# Patient Record
Sex: Male | Born: 1985 | Race: White | Hispanic: No | Marital: Married | Smoking: Never smoker
Health system: Southern US, Community
[De-identification: ages and names within clinical notes are randomized; demographics above are authoritative.]

## PROBLEM LIST (undated history)

## (undated) ENCOUNTER — Emergency Department: Source: Home / Self Care

## (undated) DIAGNOSIS — K219 Gastro-esophageal reflux disease without esophagitis: Secondary | ICD-10-CM

## (undated) DIAGNOSIS — F419 Anxiety disorder, unspecified: Secondary | ICD-10-CM

## (undated) HISTORY — PX: HERNIA REPAIR: SHX51

## (undated) HISTORY — PX: OTHER SURGICAL HISTORY: SHX169

---
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
## 2015-08-20 ENCOUNTER — Emergency Department
Admission: EM | Admit: 2015-08-20 | Discharge: 2015-08-20 | Disposition: A | Discharge status: Home / Self Care | Attending: Emergency Medicine | Admitting: Emergency Medicine

## 2015-08-20 ENCOUNTER — Encounter: Payer: Self-pay | Admitting: Emergency Medicine

## 2015-08-20 DIAGNOSIS — X500XXA Overexertion from strenuous movement or load, initial encounter: Secondary | ICD-10-CM | POA: Diagnosis not present

## 2015-08-20 DIAGNOSIS — S46911A Strain of unspecified muscle, fascia and tendon at shoulder and upper arm level, right arm, initial encounter: Secondary | ICD-10-CM | POA: Diagnosis not present

## 2015-08-20 DIAGNOSIS — S161XXA Strain of muscle, fascia and tendon at neck level, initial encounter: Secondary | ICD-10-CM | POA: Insufficient documentation

## 2015-08-20 DIAGNOSIS — Y9289 Other specified places as the place of occurrence of the external cause: Secondary | ICD-10-CM | POA: Insufficient documentation

## 2015-08-20 DIAGNOSIS — S4991XA Unspecified injury of right shoulder and upper arm, initial encounter: Secondary | ICD-10-CM | POA: Diagnosis present

## 2015-08-20 DIAGNOSIS — Y9389 Activity, other specified: Secondary | ICD-10-CM | POA: Diagnosis not present

## 2015-08-20 DIAGNOSIS — Y990 Civilian activity done for income or pay: Secondary | ICD-10-CM | POA: Insufficient documentation

## 2015-08-20 HISTORY — DX: Gastro-esophageal reflux disease without esophagitis: K21.9

## 2015-08-20 MED ORDER — IBUPROFEN 800 MG PO TABS
800.0000 mg | ORAL_TABLET | Freq: Once | ORAL | Status: AC
  Administered 2015-08-20: 800 mg via ORAL
  Filled 2015-08-20: qty 1

## 2015-08-20 MED ORDER — HYDROCODONE-ACETAMINOPHEN 5-325 MG PO TABS: 1.0000 | ORAL_TABLET | ORAL | Status: DC | PRN

## 2015-08-20 MED ORDER — DIAZEPAM 5 MG PO TABS
5.0000 mg | ORAL_TABLET | Freq: Once | ORAL | Status: AC
  Administered 2015-08-20: 5 mg via ORAL
  Filled 2015-08-20: qty 1

## 2015-08-20 MED ORDER — CYCLOBENZAPRINE HCL 10 MG PO TABS: 10.0000 mg | ORAL_TABLET | Freq: Three times a day (TID) | ORAL | Status: DC | PRN

## 2015-08-20 MED ORDER — HYDROCODONE-ACETAMINOPHEN 5-325 MG PO TABS
2.0000 | ORAL_TABLET | Freq: Once | ORAL | Status: AC
  Administered 2015-08-20: 2 via ORAL
  Filled 2015-08-20: qty 2

## 2015-08-20 MED ORDER — IBUPROFEN 800 MG PO TABS: 800.0000 mg | ORAL_TABLET | Freq: Three times a day (TID) | ORAL | Status: DC | PRN

## 2015-08-20 NOTE — Discharge Instructions (Signed)
Cervical Sprain A cervical sprain is when the tissues (ligaments) that hold the neck bones in place stretch or tear. HOME CARE   Put ice on the injured area.  Put ice in a plastic bag.  Place a towel between your skin and the bag.  Leave the ice on for 15-20 minutes, 3-4 times a day.  You may have been given a collar to wear. This collar keeps your neck from moving while you heal.  Do not take the collar off unless told by your doctor.  If you have long hair, keep it outside of the collar.  Ask your doctor before changing the position of your collar. You may need to change its position over time to make it more comfortable.  If you are allowed to take off the collar for cleaning or bathing, follow your doctor's instructions on how to do it safely.  Keep your collar clean by wiping it with mild soap and water. Dry it completely. If the collar has removable pads, remove them every 1-2 days to hand wash them with soap and water. Allow them to air dry. They should be dry before you wear them in the collar.  Do not drive while wearing the collar.  Only take medicine as told by your doctor.  Keep all doctor visits as told.  Keep all physical therapy visits as told.  Adjust your work station so that you have good posture while you work.  Avoid positions and activities that make your problems worse.  Warm up and stretch before being active. GET HELP IF:  Your pain is not controlled with medicine.  You cannot take less pain medicine over time as planned.  Your activity level does not improve as expected. GET HELP RIGHT AWAY IF:   You are bleeding.  Your stomach is upset.  You have an allergic reaction to your medicine.  You develop new problems that you cannot explain.  You lose feeling (become numb) or you cannot move any part of your body (paralysis).  You have tingling or weakness in any part of your body.  Your symptoms get worse. Symptoms include:  Pain,  soreness, stiffness, puffiness (swelling), or a burning feeling in your neck.  Pain when your neck is touched.  Shoulder or upper back pain.  Limited ability to move your neck.  Headache.  Dizziness.  Your hands or arms feel week, lose feeling, or tingle.  Muscle spasms.  Difficulty swallowing or chewing. MAKE SURE YOU:   Understand these instructions.  Will watch your condition.  Will get help right away if you are not doing well or get worse.   This information is not intended to replace advice given to you by your health care provider. Make sure you discuss any questions you have with your health care provider.   Document Released: 03/10/2008 Document Revised: 05/25/2013 Document Reviewed: 03/30/2013 Elsevier Interactive Patient Education 2016 [employer]  Muscle Strain A muscle strain is an injury that occurs when a muscle is stretched beyond its normal length. Usually a small number of muscle fibers are torn when this happens. Muscle strain is rated in degrees. First-degree strains have the least amount of muscle fiber tearing and pain. Second-degree and third-degree strains have increasingly more tearing and pain.  Usually, recovery from muscle strain takes 1-2 weeks. Complete healing takes 5-6 weeks.  CAUSES  Muscle strain happens when a sudden, violent force placed on a muscle stretches it too far. This may occur with lifting, sports, or  a fall.  RISK FACTORS Muscle strain is especially common in athletes.  SIGNS AND SYMPTOMS At the site of the muscle strain, there may be:  Pain.  Bruising.  Swelling.  Difficulty using the muscle due to pain or lack of normal function. DIAGNOSIS  Your health care provider will perform a physical exam and ask about your medical history. TREATMENT  Often, the best treatment for a muscle strain is resting, icing, and applying cold compresses to the injured area.  HOME CARE INSTRUCTIONS   Use the PRICE method of treatment  to promote muscle healing during the first 2-3 days after your injury. The PRICE method involves:  Protecting the muscle from being injured again.  Restricting your activity and resting the injured body part.  Icing your injury. To do this, put ice in a plastic bag. Place a towel between your skin and the bag. Then, apply the ice and leave it on from 15-20 minutes each hour. After the third day, switch to moist heat packs.  Apply compression to the injured area with a splint or elastic bandage. Be careful not to wrap it too tightly. This may interfere with blood circulation or increase swelling.  Elevate the injured body part above the level of your heart as often as you can.  Only take over-the-counter or prescription medicines for pain, discomfort, or fever as directed by your health care provider.  Warming up prior to exercise helps to prevent future muscle strains. SEEK MEDICAL CARE IF:   You have increasing pain or swelling in the injured area.  You have numbness, tingling, or a significant loss of strength in the injured area. MAKE SURE YOU:   Understand these instructions.  Will watch your condition.  Will get help right away if you are not doing well or get worse.   This information is not intended to replace advice given to you by your health care provider. Make sure you discuss any questions you have with your health care provider.   Document Released: 09/22/2005 Document Revised: 07/13/2013 Document Reviewed: 04/21/2013 Elsevier Interactive Patient Education [employer]

## 2015-08-20 NOTE — ED Notes (Signed)
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

## 2015-08-20 NOTE — ED Provider Notes (Signed)
[facility] Emergency Department Provider Note  ____________________________________________  Time seen: Approximately 10:32 AM  I have reviewed the triage vital signs and the nursing notes.   HISTORY  Chief Complaint Shoulder Pain    HPI Andrew Baird is a 29 y.o. male who presents with complaints of left shoulder and neck pain after lifting helicopter blade. Patient states became worse yesterday increased pain with movement. 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   Past Medical History  Diagnosis Date  . GERD (gastroesophageal reflux disease)     There are no active problems to display for this patient.   History reviewed. No pertinent past surgical history.  Current Outpatient Rx  Name  Route  Sig  Dispense  Refill  . cyclobenzaprine (FLEXERIL) 10 MG tablet   Oral   Take 1 tablet (10 mg total) by mouth every 8 (eight) hours as needed for muscle spasms.   30 tablet   1   . HYDROcodone-acetaminophen (NORCO) 5-325 MG tablet   Oral   Take 1-2 tablets by mouth every 4 (four) hours as needed for moderate pain.   10 tablet   0   . ibuprofen (ADVIL,MOTRIN) 800 MG tablet   Oral   Take 1 tablet (800 mg total) by mouth every 8 (eight) hours as needed.   30 tablet   0     Allergies Review of patient's allergies indicates no known allergies.  History reviewed. No pertinent family history.  Social History Social History  Substance Use Topics  . Smoking status: Never Smoker   . Smokeless tobacco: None  . Alcohol Use: No    Review of Systems Constitutional: No fever/chills Eyes: No visual changes. ENT: No sore throat. Cardiovascular: Denies chest pain. Respiratory: Denies shortness of breath. Gastrointestinal: No abdominal pain.  No nausea, no vomiting.  No diarrhea.  No constipation. Genitourinary: Negative for dysuria. Musculoskeletal: Positive for left shoulder pain Skin: Negative for  rash. Neurological: Negative for headaches, focal weakness or numbness.  10-point ROS otherwise negative.  ____________________________________________   PHYSICAL EXAM:  VITAL SIGNS: ED Triage Vitals  Enc Vitals Group     BP 08/20/15 0946 120/76 mmHg     Pulse --      Resp 08/20/15 0946 16     Temp 08/20/15 0946 98.6 F (37 C)     Temp src --      SpO2 08/20/15 0946 97 %     Weight 08/20/15 0946 235 lb (106.595 kg)     Height 08/20/15 0946  (1.981 m)     Head Cir --      Peak Flow --      Pain Score 08/20/15 0924 7     Pain Loc --      Pain Edu? --      Excl. in GC? --     Constitutional: Alert and oriented. Well appearing and in no acute distress. Eyes: Conjunctivae are normal. PERRL. EOMI. Head: Atraumatic. Nose: No congestion/rhinnorhea. Mouth/Throat: Mucous membranes are moist.  Oropharynx non-erythematous. Neck: No stridor.   Cardiovascular: Normal rate, regular rhythm. Grossly normal heart sounds.  Good peripheral circulation. Respiratory: Normal respiratory effort.  No retractions. Lungs CTAB. Gastrointestinal: Soft and nontender. No distention. No abdominal bruits. No CVA tenderness. Musculoskeletal: Left shoulder full range of motion and increased pain with abduction and abduction. Distal neurovascularly intact strength intact. Neurologic:  Normal speech and language. No gross focal neurologic deficits are appreciated. No gait  instability. Skin:  Skin is warm, dry and intact. No rash noted. Psychiatric: Mood and affect are normal. Speech and behavior are normal.  ____________________________________________   LABS (all labs ordered are listed, but only abnormal results are displayed)  Labs Reviewed - No data to display ____________________________________________     PROCEDURES  Procedure(s) performed: None  Critical Care performed: No  ____________________________________________   INITIAL IMPRESSION / ASSESSMENT AND PLAN / ED  COURSE  Pertinent labs & imaging results that were available during my care of the patient were reviewed by me and considered in my medical decision making (see chart for details).  Acute left shoulder strain and cervical muscle strain. Rx given for Flexeril 10 mg 3 times a day, Motrin 800 mg 3 times a day, hydrocodone as needed for severe pain. Patient follow-up with PCP or return to the ER with any worsening symptomology. No other emergency medical complaints at this time. ____________________________________________   FINAL CLINICAL IMPRESSION(S) / ED DIAGNOSES  Final diagnoses:  Shoulder strain, right, initial encounter  Neck muscle strain, initial encounter      Evangeline DakinCharles M Tayte Childers, PA-C 08/20/15 1701  Jene Everyobert Kinner, MD 08/23/15 1119

## 2015-08-20 NOTE — ED Notes (Signed)
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

## 2016-03-25 ENCOUNTER — Emergency Department
Admission: EM | Admit: 2016-03-25 | Discharge: 2016-03-25 | Disposition: A | Discharge status: Home / Self Care | Attending: Emergency Medicine | Admitting: Emergency Medicine

## 2016-03-25 ENCOUNTER — Emergency Department

## 2016-03-25 DIAGNOSIS — S0990XA Unspecified injury of head, initial encounter: Secondary | ICD-10-CM | POA: Diagnosis present

## 2016-03-25 DIAGNOSIS — S80211A Abrasion, right knee, initial encounter: Secondary | ICD-10-CM | POA: Diagnosis not present

## 2016-03-25 DIAGNOSIS — R52 Pain, unspecified: Secondary | ICD-10-CM

## 2016-03-25 DIAGNOSIS — Y939 Activity, unspecified: Secondary | ICD-10-CM | POA: Insufficient documentation

## 2016-03-25 DIAGNOSIS — T07XXXA Unspecified multiple injuries, initial encounter: Secondary | ICD-10-CM

## 2016-03-25 DIAGNOSIS — S50311A Abrasion of right elbow, initial encounter: Secondary | ICD-10-CM | POA: Diagnosis not present

## 2016-03-25 DIAGNOSIS — Y999 Unspecified external cause status: Secondary | ICD-10-CM | POA: Diagnosis not present

## 2016-03-25 DIAGNOSIS — T148 Other injury of unspecified body region: Secondary | ICD-10-CM | POA: Insufficient documentation

## 2016-03-25 DIAGNOSIS — S80212A Abrasion, left knee, initial encounter: Secondary | ICD-10-CM | POA: Diagnosis not present

## 2016-03-25 DIAGNOSIS — Y92410 Unspecified street and highway as the place of occurrence of the external cause: Secondary | ICD-10-CM | POA: Insufficient documentation

## 2016-03-25 DIAGNOSIS — S139XXA Sprain of joints and ligaments of unspecified parts of neck, initial encounter: Secondary | ICD-10-CM

## 2016-03-25 DIAGNOSIS — S134XXA Sprain of ligaments of cervical spine, initial encounter: Secondary | ICD-10-CM | POA: Insufficient documentation

## 2016-03-25 LAB — URINALYSIS COMPLETE WITH MICROSCOPIC (ARMC ONLY)
BILIRUBIN URINE: NEGATIVE
Bacteria, UA: NONE SEEN
GLUCOSE, UA: NEGATIVE mg/dL
HGB URINE DIPSTICK: NEGATIVE
Ketones, ur: NEGATIVE mg/dL
LEUKOCYTES UA: NEGATIVE
Nitrite: NEGATIVE
Protein, ur: NEGATIVE mg/dL
SPECIFIC GRAVITY, URINE: 1.016 (ref 1.005–1.030)
Squamous Epithelial / LPF: NONE SEEN
pH: 7 (ref 5.0–8.0)

## 2016-03-25 LAB — COMPREHENSIVE METABOLIC PANEL
ALBUMIN: 4.6 g/dL (ref 3.5–5.0)
ALT: 24 U/L (ref 17–63)
ANION GAP: 6 (ref 5–15)
AST: 24 U/L (ref 15–41)
Alkaline Phosphatase: 59 U/L (ref 38–126)
BILIRUBIN TOTAL: 0.5 mg/dL (ref 0.3–1.2)
BUN: 13 mg/dL (ref 6–20)
CO2: 25 mmol/L (ref 22–32)
Calcium: 9.2 mg/dL (ref 8.9–10.3)
Chloride: 107 mmol/L (ref 101–111)
Creatinine, Ser: 0.82 mg/dL (ref 0.61–1.24)
GFR calc Af Amer: >60 mL/min (ref >60)
GLUCOSE: 108 mg/dL — AB (ref 65–99)
POTASSIUM: 3.8 mmol/L (ref 3.5–5.1)
Sodium: 138 mmol/L (ref 135–145)
TOTAL PROTEIN: 7.6 g/dL (ref 6.5–8.1)

## 2016-03-25 LAB — CBC WITH DIFFERENTIAL/PLATELET
BASOS PCT: 0 %
Basophils Absolute: 0 10*3/uL (ref 0–0.1)
EOS ABS: 0.1 10*3/uL (ref 0–0.7)
EOS PCT: 2 %
HEMATOCRIT: 40.2 % (ref 40.0–52.0)
Hemoglobin: 13.9 g/dL (ref 13.0–18.0)
Lymphocytes Relative: 25 %
Lymphs Abs: 1.8 10*3/uL (ref 1.0–3.6)
MCH: 29 pg (ref 26.0–34.0)
MCHC: 34.7 g/dL (ref 32.0–36.0)
MCV: 83.7 fL (ref 80.0–100.0)
MONO ABS: 0.6 10*3/uL (ref 0.2–1.0)
MONOS PCT: 9 %
NEUTROS ABS: 4.6 10*3/uL (ref 1.4–6.5)
Neutrophils Relative %: 64 %
PLATELETS: 183 10*3/uL (ref 150–440)
RBC: 4.8 MIL/uL (ref 4.40–5.90)
RDW: 13.6 % (ref 11.5–14.5)
WBC: 7.1 10*3/uL (ref 3.8–10.6)

## 2016-03-25 MED ORDER — TRAMADOL HCL 50 MG PO TABS: 50.0000 mg | ORAL_TABLET | Freq: Four times a day (QID) | ORAL | Status: DC | PRN

## 2016-03-25 MED ORDER — TETANUS-DIPHTH-ACELL PERTUSSIS 5-2.5-18.5 LF-MCG/0.5 IM SUSP
0.5000 mL | Freq: Once | INTRAMUSCULAR | Status: AC
Start: 2016-03-25 — End: 2016-03-25
  Administered 2016-03-25: 0.5 mL via INTRAMUSCULAR
  Filled 2016-03-25: qty 0.5
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
## 2016-09-02 ENCOUNTER — Emergency Department

## 2016-09-02 ENCOUNTER — Encounter: Payer: Self-pay | Admitting: Emergency Medicine

## 2016-09-02 ENCOUNTER — Emergency Department
Admission: EM | Admit: 2016-09-02 | Discharge: 2016-09-02 | Disposition: A | Discharge status: Home / Self Care | Attending: Emergency Medicine | Admitting: Emergency Medicine

## 2016-09-02 DIAGNOSIS — R42 Dizziness and giddiness: Secondary | ICD-10-CM | POA: Insufficient documentation

## 2016-09-02 DIAGNOSIS — R079 Chest pain, unspecified: Secondary | ICD-10-CM | POA: Insufficient documentation

## 2016-09-02 HISTORY — DX: Anxiety disorder, unspecified: F41.9

## 2016-09-02 LAB — BASIC METABOLIC PANEL
ANION GAP: 8 (ref 5–15)
BUN: 14 mg/dL (ref 6–20)
CHLORIDE: 103 mmol/L (ref 101–111)
CO2: 26 mmol/L (ref 22–32)
Calcium: 9.3 mg/dL (ref 8.9–10.3)
Creatinine, Ser: 1 mg/dL (ref 0.61–1.24)
GFR calc non Af Amer: >60 mL/min (ref >60)
Glucose, Bld: 102 mg/dL — ABNORMAL HIGH (ref 65–99)
Potassium: 3.8 mmol/L (ref 3.5–5.1)
Sodium: 137 mmol/L (ref 135–145)

## 2016-09-02 LAB — CBC
HCT: 43.4 % (ref 40.0–52.0)
HEMOGLOBIN: 14.9 g/dL (ref 13.0–18.0)
MCH: 28.8 pg (ref 26.0–34.0)
MCHC: 34.4 g/dL (ref 32.0–36.0)
MCV: 84 fL (ref 80.0–100.0)
Platelets: 207 10*3/uL (ref 150–440)
RBC: 5.17 MIL/uL (ref 4.40–5.90)
RDW: 13.5 % (ref 11.5–14.5)
WBC: 7.5 10*3/uL (ref 3.8–10.6)

## 2016-09-02 LAB — FIBRIN DERIVATIVES D-DIMER (ARMC ONLY): Fibrin derivatives D-dimer (ARMC): 95 (ref 0–499)

## 2016-09-02 LAB — TROPONIN I: Troponin I: <0.03 ng/mL (ref <0.03)

## 2016-09-02 NOTE — ED Provider Notes (Signed)
[facility] Emergency Department Provider Note  ____________________________________________   First MD Initiated Contact with Patient 09/02/16 1850     (approximate)  I have reviewed the triage vital signs and the nursing notes.   HISTORY  Chief Complaint Chest Pain   HPI Andrew Baird is a 30 y.o. male with a history of anxiety as well as GERD was present. Emergency department today with left-sided chest pain. He says the pain is an ongoing since this past Thursday. He says he was moving couches when he began feeling nauseous and then started having intermittent left-sided chest pain. He says the pain may last for several minutes and never completely goes away but does get better and worse. He does not report any exacerbating factors. Denies any associated shortness of breath. The pain is moderate amount cramping with some radiation down his left upper extremity. Says that [facility] and was discharged with a diagnosis of muscle. He says that he also had a mirror hit his left calf this past week and says that his left calf has been sore but not swollen at this time. Does not report any worsening of the pain with deep breathing.    Past Medical History:  Diagnosis Date  . Anxiety   . GERD (gastroesophageal reflux disease)     There are no active problems to display for this patient.   Past Surgical History:  Procedure Laterality Date  . CATARACT SURGERY    . HERNIA REPAIR    . RIGHT KNEE SURGERY      Prior to Admission medications   Medication Sig Start Date End Date Taking? Authorizing Provider  traMADol (ULTRAM) 50 MG tablet Take 1 tablet (50 mg total) by mouth every 6 (six) hours as needed. 03/25/16 03/25/17  Leona CarryLinda M Taylor, MD    Allergies Patient has no known allergies.  No family history on file.  Social History Social History  Substance Use Topics  . Smoking status: Never Smoker  .  Smokeless tobacco: Never Used  . Alcohol use Yes     Comment: 1-2 BEERS A WEEK    Review of Systems Constitutional: No fever/chills Eyes: No visual changes. ENT: No sore throat. Cardiovascular: As above Respiratory: Denies shortness of breath. Gastrointestinal: No abdominal pain.  No nausea, no vomiting.  No diarrhea.  No constipation. Genitourinary: Negative for dysuria. Musculoskeletal: Negative for back pain. Skin: Negative for rash. Neurological: Negative for headaches, focal weakness or numbness.  10-point ROS otherwise negative.  ____________________________________________   PHYSICAL EXAM:  VITAL SIGNS: ED Triage Vitals  Enc Vitals Group     BP 09/02/16 1757 126/82     Pulse Rate 09/02/16 1757 64     Resp 09/02/16 1757 16     Temp 09/02/16 1757 97.6 F (36.4 C)     Temp Source 09/02/16 1757 Oral     SpO2 09/02/16 1757 98 %     Weight 09/02/16 1757 245 lb (111.1 kg)     Height 09/02/16 1757 6\' 4"  (1.93 m)     Head Circumference --      Peak Flow --      Pain Score 09/02/16 1758 1     Pain Loc --      Pain Edu? --      Excl. in GC? --     Constitutional: Alert and oriented. Well appearing and in no acute distress. Eyes: Conjunctivae are normal. PERRL. EOMI. Head:  Atraumatic. Nose: No congestion/rhinnorhea. Mouth/Throat: Mucous membranes are moist.   Neck: No stridor.   Cardiovascular: Normal rate, regular rhythm. Grossly normal heart sounds.  Good peripheral circulation With equal bilateral radial pulses. Chest pain is reproducible palpation to the left anterior axillary line. No deformity or crepitus. Respiratory: Normal respiratory effort.  No retractions. Lungs CTAB. Gastrointestinal: Soft and nontender. No distention. No CVA tenderness. Musculoskeletal: No lower extremity tenderness nor edema.  No joint effusions. Neurologic:  Normal speech and language. No gross focal neurologic deficits are appreciated. No gait instability. Skin:  Skin is warm, dry  and intact. No rash noted. Psychiatric: Mood and affect are normal. Speech and behavior are normal.  ____________________________________________   LABS (all labs ordered are listed, but only abnormal results are displayed)  Labs Reviewed  BASIC METABOLIC PANEL - Abnormal; Notable for the following:       Result Value   Glucose, Bld 102 (*)    All other components within normal limits  CBC  TROPONIN I  FIBRIN DERIVATIVES D-DIMER ([facility] ONLY)   ____________________________________________  EKG  ED ECG REPORT I, Arelia LongestSchaevitz,  Julyan Gales M, the attending physician, personally viewed and interpreted this ECG.   Date: 09/02/2016  EKG Time: 1753  Rate: 57  Rhythm: sinus bradycardia  Axis: Normal axis  Intervals: Normal intervals  ST&T Change: No ST elevation or depression. No abnormal T-wave inversion.  ____________________________________________  RADIOLOGY  DG Chest 2 View (Final result)  Result time 09/02/16 18:24:23  Final result by Elie Goodyhuck Clark, MD (09/02/16 18:24:23)           Narrative:   CLINICAL DATA: Chest pain  EXAM: CHEST 2 VIEW  COMPARISON: 03/25/2016  FINDINGS: The heart size and mediastinal contours are within normal limits. Both lungs are clear. The visualized skeletal structures are unremarkable.  IMPRESSION: No active cardiopulmonary disease.   Electronically Signed By: Marlan Palauharles Clark M.D. On: 09/02/2016 18:24          ____________________________________________   PROCEDURES  Procedure(s) performed:   Procedures  Critical Care performed:   ____________________________________________   INITIAL IMPRESSION / ASSESSMENT AND PLAN / ED COURSE  Pertinent labs & imaging results that were available during my care of the patient were reviewed by me and considered in my medical decision making (see chart for details).    Clinical Course   ----------------------------------------- 8:43 PM on  09/02/2016 -----------------------------------------  Patient resting without any distress at this time. Very reassuring workup including a negative d-dimer. EKG as well as labs are reassuring and the symptoms have been ongoing for about 6 days at this time. Very low suspicion that the patient's condition is related to a cardiac cause. Most likely is a chest wall issue. However, we discussed possible stress test and [facility]. We discussed the lab results as well as imaging results and the patient understands plan and is willing to comply. Will be discharged. We also discussed using a salve such as Aspercreme or icy on in addition to the Naprosyn that [facility].   ____________________________________________   FINAL CLINICAL IMPRESSION(S) / ED DIAGNOSES  Chest pain.    NEW MEDICATIONS STARTED DURING THIS VISIT:  New Prescriptions   No medications on file     Note:  This document was prepared using Dragon voice recognition software and may include unintentional dictation errors.    Myrna Blazeravid Matthew Sherrian Nunnelley, MD 09/02/16 [phone]

## 2016-09-02 NOTE — ED Notes (Signed)
Pt. Verbalizes understanding of d/c instructions and follow-up. VS stable and pain controlled per pt.  Pt. In NAD at time of d/c and denies further concerns regarding this visit. Pt. Stable at the time of departure from the unit, departing unit by the safest and most appropriate manner per that pt condition and limitations. Pt advised to return to the ED at any time for emergent concerns, or for new/worsening symptoms.   

## 2016-09-02 NOTE — ED Triage Notes (Signed)
Patient presents to the ED with left sided chest pain that started intermittently approx. 1 week ago but has gotten worse and was constant for most of the day today.  Patient reports feeling dizzy and light-headed with the chest pain.  Patient reports being seen at the hospital at [facility] last night and was told he had a pulled muscle. Patient states, "there's no way."  Patient reports anxiety disorder due to [employer].  Patient is in no obvious distress at this time.

## 2017-01-24 ENCOUNTER — Emergency Department
Admission: EM | Admit: 2017-01-24 | Discharge: 2017-01-24 | Disposition: A | Discharge status: Home / Self Care | Attending: Emergency Medicine | Admitting: Emergency Medicine

## 2017-01-24 ENCOUNTER — Encounter: Payer: Self-pay | Admitting: Medical Oncology

## 2017-01-24 ENCOUNTER — Emergency Department

## 2017-01-24 DIAGNOSIS — Y9344 Activity, trampolining: Secondary | ICD-10-CM | POA: Diagnosis not present

## 2017-01-24 DIAGNOSIS — S93491A Sprain of other ligament of right ankle, initial encounter: Secondary | ICD-10-CM | POA: Insufficient documentation

## 2017-01-24 DIAGNOSIS — Y9289 Other specified places as the place of occurrence of the external cause: Secondary | ICD-10-CM | POA: Insufficient documentation

## 2017-01-24 DIAGNOSIS — S99911A Unspecified injury of right ankle, initial encounter: Secondary | ICD-10-CM | POA: Diagnosis present

## 2017-01-24 DIAGNOSIS — X501XXA Overexertion from prolonged static or awkward postures, initial encounter: Secondary | ICD-10-CM | POA: Diagnosis not present

## 2017-01-24 DIAGNOSIS — Y998 Other external cause status: Secondary | ICD-10-CM | POA: Insufficient documentation

## 2017-01-24 MED ORDER — TRAMADOL HCL 50 MG PO TABS
50.0000 mg | ORAL_TABLET | Freq: Once | ORAL | Status: AC
  Administered 2017-01-24: 50 mg via ORAL
  Filled 2017-01-24: qty 1

## 2017-01-24 MED ORDER — TRAMADOL HCL 50 MG PO TABS: 50.0000 mg | ORAL_TABLET | Freq: Four times a day (QID) | ORAL | 0 refills | Status: DC | PRN

## 2017-01-24 MED ORDER — NAPROXEN 500 MG PO TABS: 500.0000 mg | ORAL_TABLET | Freq: Two times a day (BID) | ORAL | 0 refills | Status: DC

## 2017-01-24 MED ORDER — IBUPROFEN 600 MG PO TABS
600.0000 mg | ORAL_TABLET | Freq: Once | ORAL | Status: AC
  Administered 2017-01-24: 600 mg via ORAL
  Filled 2017-01-24: qty 1

## 2017-01-24 NOTE — Discharge Instructions (Signed)
Follow up with podiatry if not improving over the week. Use the crutches as long as it is painful to bear weight. Return to the ER for symptoms that change or worsen if you are unable to schedule an appointment.

## 2017-01-24 NOTE — ED Triage Notes (Signed)
Pt to ed with c/o right ankle pain and swelling [place].

## 2017-01-24 NOTE — ED Notes (Signed)
Pt with swelling noted to lateral right ankle. Pt states [place] when he injured his right ankle. Ice applied by this rn.

## 2017-01-24 NOTE — ED Notes (Signed)
Splint was applied to pt's right ankle per PA order. PT was in instructed on how to care for ankle in splint. Pt was educated about any potential adverse signs that could happen regarding the splint. PT verbalized understanding with this and crutch use. RN notified of task completion

## 2017-01-24 NOTE — ED Provider Notes (Signed)
[facility] Emergency Department Provider Note ____________________________________________  Time seen: Approximately 7:00 PM  I have reviewed the triage vital signs and the nursing notes.   HISTORY  Chief Complaint Ankle Pain    HPI Andrew Baird is a 31 y.o. male who presents to the emergency department for evaluation of right ankle pain. He was [place] a bounce house and twisted his ankle. He has had a previous ankle sprain to the same. No alleviating measures attempted prior to arrival.  Past Medical History:  Diagnosis Date  . Anxiety   . GERD (gastroesophageal reflux disease)     There are no active problems to display for this patient.   Past Surgical History:  Procedure Laterality Date  . CATARACT SURGERY    . HERNIA REPAIR    . RIGHT KNEE SURGERY      Prior to Admission medications   Medication Sig Start Date End Date Taking? Authorizing Provider  naproxen (NAPROSYN) 500 MG tablet Take 1 tablet (500 mg total) by mouth 2 (two) times daily with a meal. 01/24/17   Mattea Seger B Diego Ulbricht, FNP  traMADol (ULTRAM) 50 MG tablet Take 1 tablet (50 mg total) by mouth every 6 (six) hours as needed. 01/24/17   Chinita Pester, FNP    Allergies Patient has no known allergies.  History reviewed. No pertinent family history.  Social History Social History  Substance Use Topics  . Smoking status: Never Smoker  . Smokeless tobacco: Never Used  . Alcohol use Yes     Comment: 1-2 BEERS A WEEK    Review of Systems Constitutional: No recent illness. Cardiovascular: Denies chest pain or palpitations. Respiratory: Denies shortness of breath. Musculoskeletal: Pain in Right ankle Skin: Negative for rash, wound, lesion. Neurological: Negative for focal weakness or numbness.  ____________________________________________   PHYSICAL EXAM:  VITAL SIGNS: ED Triage Vitals  Enc Vitals Group     BP 01/24/17 1819 128/72     Pulse Rate 01/24/17 1819 81     Resp  01/24/17 1819 18     Temp 01/24/17 1819 97.6 F (36.4 C)     Temp Source 01/24/17 1819 Oral     SpO2 01/24/17 1819 99 %     Weight 01/24/17 1818 245 lb (111.1 kg)     Height --      Head Circumference --      Peak Flow --      Pain Score 01/24/17 1817 4     Pain Loc --      Pain Edu? --      Excl. in GC? --     Constitutional: Alert and oriented. Well appearing and in no acute distress. Eyes: Conjunctivae are normal. EOMI. Head: Atraumatic. Neck: No stridor.  Respiratory: Normal respiratory effort.   Musculoskeletal: ATFL pattern swelling and pain around the right ankle. [place] ankle rules are negative. Neurologic:  Normal speech and language. No gross focal neurologic deficits are appreciated. Speech is normal. No gait instability. Skin:  Skin is warm, dry and intact. Atraumatic. Psychiatric: Mood and affect are normal. Speech and behavior are normal.  ____________________________________________   LABS (all labs ordered are listed, but only abnormal results are displayed)  Labs Reviewed - No data to display ____________________________________________  RADIOLOGY  Right ankle negative for acute bony abnormality per radiology. I, Kem Boroughs, personally viewed and evaluated these images (plain radiographs) as part of my medical decision making, as well as reviewing the written report by the radiologist. ___________________________________________  PROCEDURES  Procedure(s) performed: Velcro ankle splint applied by ER tech. Patient neurovascularly intact post application.   ____________________________________________   INITIAL IMPRESSION / ASSESSMENT AND PLAN / ED COURSE  31 year old male who presents to the emergency department for evaluation of right ankle pain and swelling. X-ray and exam are consistent with sprain. He is to follow up with the podiatrist if symptoms not improving over the week. He is to return to the ER for symptoms that change or worsen if  unable to schedule an appointment.   Pertinent labs & imaging results that were available during my care of the patient were reviewed by me and considered in my medical decision making (see chart for details).  _________________________________________   FINAL CLINICAL IMPRESSION(S) / ED DIAGNOSES  Final diagnoses:  Sprain of anterior talofibular ligament of right ankle, initial encounter    Discharge Medication List as of 01/24/2017  8:19 PM    START taking these medications   Details  naproxen (NAPROSYN) 500 MG tablet Take 1 tablet (500 mg total) by mouth 2 (two) times daily with a meal., Starting Sat 01/24/2017, Print        If controlled substance prescribed during this visit, 12 month history viewed on the [facility] prior to issuing an initial prescription for Schedule II or III opiod.    Chinita Pester, FNP 01/24/17 1610    Sharman Cheek, MD 01/25/17 1714

## 2017-10-04 ENCOUNTER — Emergency Department
Admission: EM | Admit: 2017-10-04 | Discharge: 2017-10-04 | Disposition: A | Discharge status: Home / Self Care | Attending: Emergency Medicine | Admitting: Emergency Medicine

## 2017-10-04 ENCOUNTER — Emergency Department

## 2017-10-04 ENCOUNTER — Other Ambulatory Visit: Payer: Self-pay

## 2017-10-04 DIAGNOSIS — M542 Cervicalgia: Secondary | ICD-10-CM | POA: Diagnosis present

## 2017-10-04 DIAGNOSIS — F419 Anxiety disorder, unspecified: Secondary | ICD-10-CM | POA: Diagnosis not present

## 2017-10-04 DIAGNOSIS — Z79899 Other long term (current) drug therapy: Secondary | ICD-10-CM | POA: Diagnosis not present

## 2017-10-04 DIAGNOSIS — M436 Torticollis: Secondary | ICD-10-CM | POA: Diagnosis not present

## 2017-10-04 LAB — CBC
HCT: 44.9 % (ref 40.0–52.0)
Hemoglobin: 15.2 g/dL (ref 13.0–18.0)
MCH: 28.5 pg (ref 26.0–34.0)
MCHC: 33.9 g/dL (ref 32.0–36.0)
MCV: 84.1 fL (ref 80.0–100.0)
PLATELETS: 207 10*3/uL (ref 150–440)
RBC: 5.34 MIL/uL (ref 4.40–5.90)
RDW: 13 % (ref 11.5–14.5)
WBC: 6.6 10*3/uL (ref 3.8–10.6)

## 2017-10-04 LAB — BASIC METABOLIC PANEL
Anion gap: 8 (ref 5–15)
BUN: 13 mg/dL (ref 6–20)
CHLORIDE: 105 mmol/L (ref 101–111)
CO2: 27 mmol/L (ref 22–32)
CREATININE: 0.86 mg/dL (ref 0.61–1.24)
Calcium: 9.5 mg/dL (ref 8.9–10.3)
Glucose, Bld: 86 mg/dL (ref 65–99)
POTASSIUM: 3.9 mmol/L (ref 3.5–5.1)
Sodium: 140 mmol/L (ref 135–145)

## 2017-10-04 LAB — TROPONIN I: Troponin I: <0.03 ng/mL (ref <0.03)

## 2017-10-04 MED ORDER — TRAMADOL HCL 50 MG PO TABS: 50.0000 mg | ORAL_TABLET | Freq: Four times a day (QID) | ORAL | 0 refills | Status: AC | PRN

## 2017-10-04 MED ORDER — OXYCODONE-ACETAMINOPHEN 5-325 MG PO TABS
1.0000 | ORAL_TABLET | Freq: Once | ORAL | Status: AC
  Administered 2017-10-04: 1 via ORAL
  Filled 2017-10-04: qty 1

## 2017-10-04 NOTE — ED Provider Notes (Addendum)
[facility] Emergency Department Provider Note  ____________________________________________   I have reviewed the triage vital signs and the nursing notes. Where available I have reviewed prior notes and, if possible and indicated, outside hospital notes.    HISTORY  Chief Complaint Back Pain; Neck Pain; Shoulder Pain; and Chest Pain (Chest Wall)    HPI Andrew Baird is a 31 y.o. male PTSD, and noncardiac chest pain with extensive cardiac workup including Holter monitor, cardiology referral etc. in the past, he presents today complaining of neck pain.  Patient states he slept wrong in his neck and woke up 3 days ago with a kink in his neck.  He denies any trauma, he has been splinting that area and not moving it very much because it hurts.  He is been taking ibuprofen.  Patient has had pain in his neck for the last 3 days, radiates "everywhere".  Mostly to the trapezius muscle on the right.  It is worse when he moves his head to the left better when he holds his head position straight.  Is a sharp pain.  No other associated symptoms.  Denies any chest pain shortness breath nausea or vomiting no exertional symptoms.  He states he took some Flexeril today but it seemed like it made the pain worse.  He denies any leg swelling, does have a history of anxiety, does not routinely use narcotics, review the DEA provider database states the patient has had 2 different prescriptions only for narcotics in the last 2 years, Are both for tramadol, none since September both for tramadol none since September 2017.  He is not a routine narcotic user. Denies any specific trauma or focal numbness or weakness  Past Medical History:  Diagnosis Date  . Anxiety   . GERD (gastroesophageal reflux disease)     There are no active problems to display for this patient.   Past Surgical History:  Procedure Laterality Date  . CATARACT SURGERY    . HERNIA REPAIR    . RIGHT KNEE SURGERY       Prior to Admission medications   Medication Sig Start Date End Date Taking? Authorizing Provider  naproxen (NAPROSYN) 500 MG tablet Take 1 tablet (500 mg total) by mouth 2 (two) times daily with a meal. 01/24/17   Triplett, Cari B, FNP  traMADol (ULTRAM) 50 MG tablet Take 1 tablet (50 mg total) by mouth every 6 (six) hours as needed. 01/24/17   Chinita Pesterriplett, Cari B, FNP    Allergies Patient has no known allergies.  No family history on file.  Social History Social History   Tobacco Use  . Smoking status: Never Smoker  . Smokeless tobacco: Never Used  Substance Use Topics  . Alcohol use: Yes    Comment: 1-2 BEERS A WEEK  . Drug use: Not on file    Review of Systems Constitutional: No fever/chills Eyes: No visual changes. ENT: No sore throat. No stiff neck no neck pain Cardiovascular: Denies chest pain. Respiratory: Denies shortness of breath. Gastrointestinal:   no vomiting.  No diarrhea.  No constipation. Genitourinary: Negative for dysuria. Musculoskeletal: Negative lower extremity swelling Skin: Negative for rash. Neurological: Negative for severe headaches, focal weakness or numbness.   ____________________________________________   PHYSICAL EXAM:  VITAL SIGNS: ED Triage Vitals [10/04/17 1432]  Enc Vitals Group     BP (!) 144/78     Pulse Rate 66     Resp 20     Temp 97.9 F (36.6 C)  Temp Source Oral     SpO2 98 %     Weight 245 lb (111.1 kg)     Height 6\' 6"  (1.981 m)     Head Circumference      Peak Flow      Pain Score 7     Pain Loc      Pain Edu?      Excl. in GC?     Constitutional: Alert and oriented. Well appearing and in no acute distress. Eyes: Conjunctivae are normal Head: Atraumatic HEENT: No congestion/rhinnorhea. Mucous membranes are moist.  Oropharynx non-erythematous Neck:   There is very reproducible tenderness to palpation in the trapezius muscle does not cross the midline, there is some palpable spasm.  No shingles no lesions  no erythema not swollen not red, no meningismus, no masses, no stridor Cardiovascular: Normal rate, regular rhythm. Grossly normal heart sounds.  Good peripheral circulation. Respiratory: Normal respiratory effort.  No retractions. Lungs CTAB. Abdominal: Soft and nontender. No distention. No guarding no rebound Back:  There is no focal tenderness or step off.  there is no midline tenderness there are no lesions noted. there is no CVA tenderness  Musculoskeletal: No lower extremity tenderness, no upper extremity tenderness. No joint effusions, no DVT signs strong distal pulses no edema Neurologic:  Normal speech and language. No gross focal neurologic deficits are appreciated.  Strength and sensation are intact reflexes are intact Skin:  Skin is warm, dry and intact. No rash noted. Psychiatric: Mood and affect are normal. Speech and behavior are normal.  ____________________________________________   LABS (all labs ordered are listed, but only abnormal results are displayed)  Labs Reviewed  BASIC METABOLIC PANEL  CBC  TROPONIN I    Pertinent labs  results that were available during my care of the patient were reviewed by me and considered in my medical decision making (see chart for details). ____________________________________________  EKG  I personally interpreted any EKGs ordered by me or triage Normal sinus rhythm, rate 70 bpm, no acute ST elevation or depression nonspecific ST changes noted inferiorly which are not significantly different from prior to my read, normal axis, ____________________________________________  RADIOLOGY  Pertinent labs & imaging results that were available during my care of the patient were reviewed by me and considered in my medical decision making (see chart for details). If possible, patient and/or family made aware of any abnormal findings.  Dg Chest 2 View  Result Date: 10/04/2017 CLINICAL DATA:  Right chest pain starting this morning EXAM:  CHEST  2 VIEW COMPARISON:  September 02, 2016 FINDINGS: The heart size and mediastinal contours are within normal limits. There is mild patchy opacity of the right lung base. There is no pulmonary edema or pleural effusion. The visualized skeletal structures are unremarkable. IMPRESSION: Mild patchy opacity of the right lung base which can be seen in the atelectasis or early pneumonia. Electronically Signed   By: Sherian Rein M.D.   On: 10/04/2017 15:07   ____________________________________________    PROCEDURES  Procedure(s) performed: None  Procedures  Critical Care performed: None  ____________________________________________   INITIAL IMPRESSION / ASSESSMENT AND PLAN / ED COURSE  Pertinent labs & imaging results that were available during my care of the patient were reviewed by me and considered in my medical decision making (see chart for details).  Patient with very reproducible muscular skeletal symptoms of torticollis without any evidence of abscess infection cellulitis neurologic injury radiculopathy ACS PE dissection pulmonary embolism or any other intrathoracic pathology.  The issue really is pain control and time.  We will try the patient on a short course of tramadol as this has worked for him in the past for similar, and we will have him follow closely with PCP.  Patient has negative troponin despite 3 days of pain he has very reproducible pain, no risk factors for PE, extensive cardiac workup in the past, I do not think he has any evidence of pneumonia atelectasis is noted on chest x-ray I think is unrelated, there is no evidence that he is significantly splinting, I do not think a CT scan is indicated, there is no evidence of trauma, this was a "woke up and found it" there is no evidence of abscess or hematoma, extensively worked up for cardiac and other pathologies all workups have been negative thus far.  I think therefore some limited to the degree of investigation is  appropriate here.  Nonetheless we did do a full cardiac workup and which is reassuring and I do not think serial enzymes are indicated for the above reason.  Finally, patient was requesting "something stronger" than tramadol states that he would like to have Percocet or something stronger at home.  This is certainly something we are not able to provide    ____________________________________________   FINAL CLINICAL IMPRESSION(S) / ED DIAGNOSES  Final diagnoses:  None      This chart was dictated using voice recognition software.  Despite best efforts to proofread,  errors can occur which can change meaning.      Jeanmarie PlantMcShane, James A, MD 10/04/17 1609    Jeanmarie PlantMcShane, James A, MD 10/04/17 [phone]

## 2017-10-04 NOTE — ED Notes (Signed)
Per Dr. Darnelle CatalanMalinda, pt needs next room due to changes in EKG, charge nurse stephanie aware.

## 2017-10-04 NOTE — ED Notes (Signed)
Pt wanting something stronger than Tramadol for his pain. He was given a Percocet before discharge. Explained that the DEA is cracking down on the prescribers and they are unable to give anything else. Pt upset and would not sign his discharge. Reviewed his instructions and talked about heat and ice to help with muscle pain. Pt walked out with friends. VSS. NAD. Ambulatory to POV without difficulty.

## 2017-10-04 NOTE — ED Triage Notes (Signed)
PT arrived via POV from home with reports of right shoulder and neck pain, that radiates to the back, pt also states the pain shoots up to the right side of his chest. Pt states it hurts to turn his head to the right. Pt denies any injury, states the pain is dull and sharp.  Pt states he took Flexeril about 2 hours PTA.

## 2018-01-16 IMAGING — CR DG CHEST 2V
1 series · 2 of 2 positions shown · non-contrast
Comparison: 03/25/2016

CLINICAL DATA: Chest pain

EXAM:
CHEST  2 VIEW

[Series 1: dg chest 2 view · 0.14mm/px · 2 of 2 slices shown]
[im 1/2]
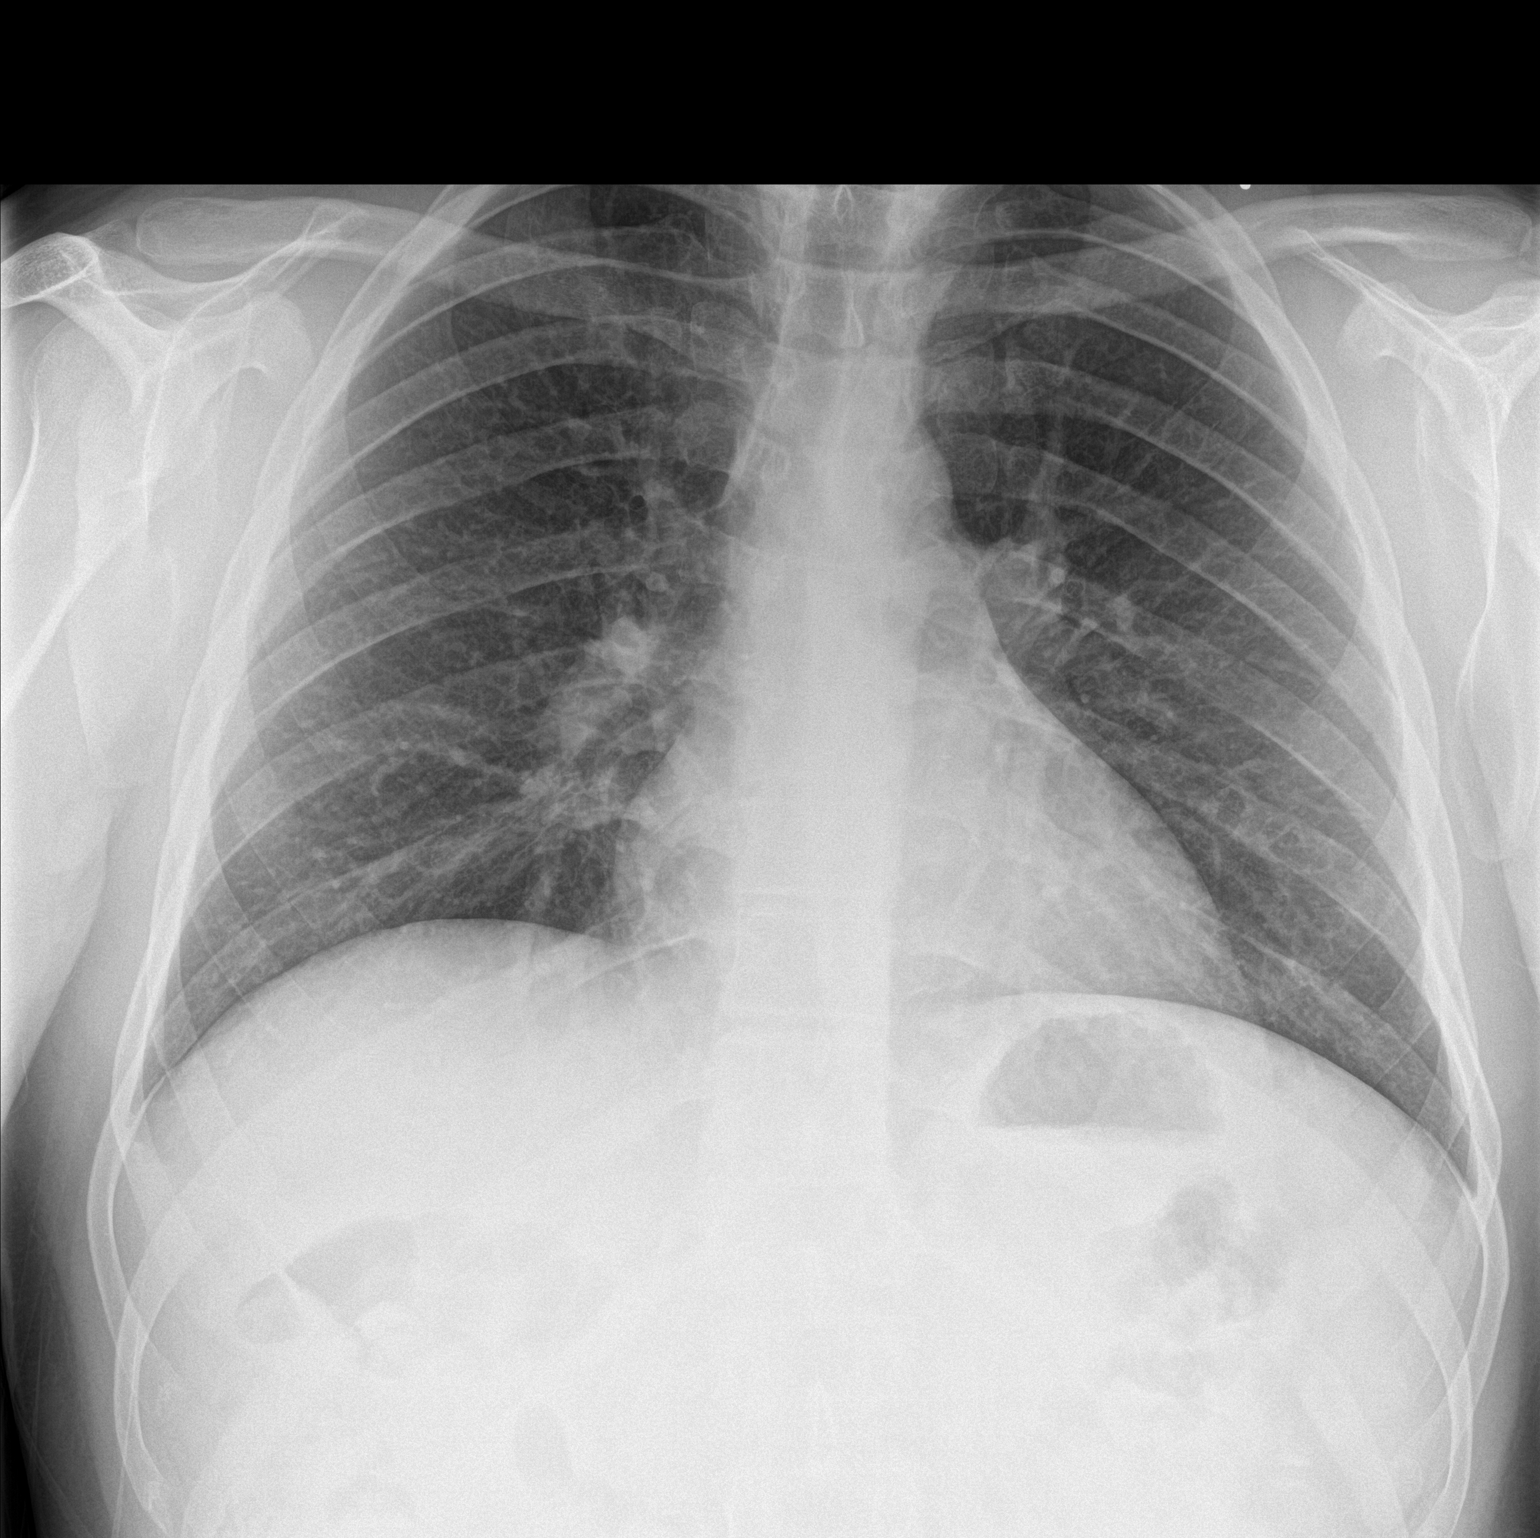
[im 2/2]
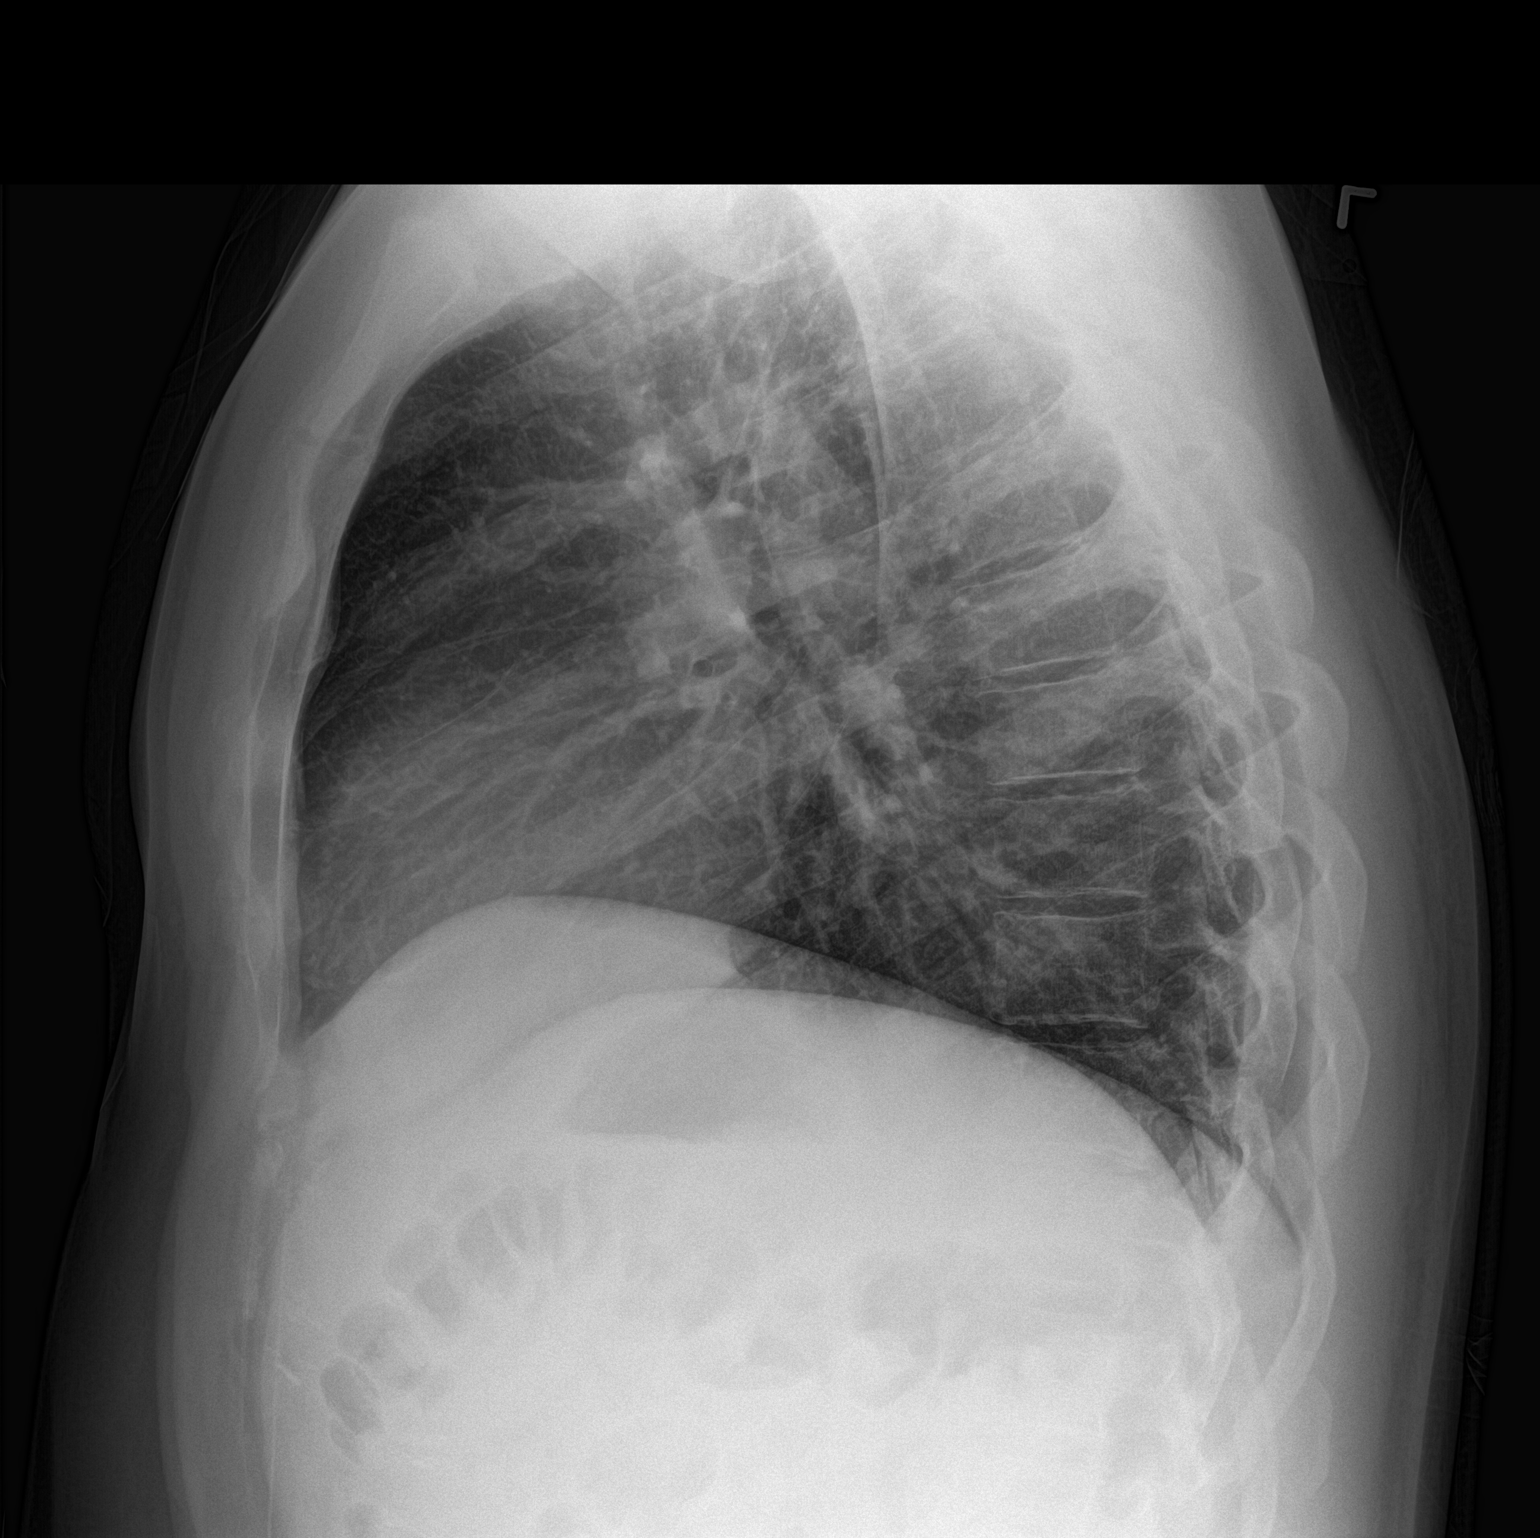

[2 of 2 positions shown; findings below may reference images not displayed]

FINDINGS: The heart size and mediastinal contours are within normal limits.
Both lungs are clear. The visualized skeletal structures are
unremarkable.
IMPRESSION: No active cardiopulmonary disease.

## 2018-12-07 ENCOUNTER — Encounter: Payer: Self-pay | Admitting: Emergency Medicine

## 2018-12-07 ENCOUNTER — Emergency Department
Admission: EM | Admit: 2018-12-07 | Discharge: 2018-12-07 | Disposition: A | Discharge status: Home / Self Care | Attending: Emergency Medicine | Admitting: Emergency Medicine

## 2018-12-07 ENCOUNTER — Emergency Department

## 2018-12-07 DIAGNOSIS — F419 Anxiety disorder, unspecified: Secondary | ICD-10-CM | POA: Diagnosis not present

## 2018-12-07 DIAGNOSIS — Y9389 Activity, other specified: Secondary | ICD-10-CM | POA: Insufficient documentation

## 2018-12-07 DIAGNOSIS — M24811 Other specific joint derangements of right shoulder, not elsewhere classified: Secondary | ICD-10-CM | POA: Diagnosis not present

## 2018-12-07 DIAGNOSIS — Y929 Unspecified place or not applicable: Secondary | ICD-10-CM | POA: Diagnosis not present

## 2018-12-07 DIAGNOSIS — W1789XA Other fall from one level to another, initial encounter: Secondary | ICD-10-CM | POA: Diagnosis not present

## 2018-12-07 DIAGNOSIS — S8992XA Unspecified injury of left lower leg, initial encounter: Secondary | ICD-10-CM | POA: Diagnosis present

## 2018-12-07 DIAGNOSIS — S8002XA Contusion of left knee, initial encounter: Secondary | ICD-10-CM | POA: Diagnosis not present

## 2018-12-07 DIAGNOSIS — Y998 Other external cause status: Secondary | ICD-10-CM | POA: Diagnosis not present

## 2018-12-07 MED ORDER — NAPROXEN 500 MG PO TABS: 500.0000 mg | ORAL_TABLET | Freq: Two times a day (BID) | ORAL | Status: DC

## 2018-12-07 NOTE — ED Notes (Signed)
See triage note  Presents s/p fall  States he fell from mower yesterday  Having pain left knee and right shoulder  No deformity noted  Was ambulatory to room

## 2018-12-07 NOTE — ED Triage Notes (Signed)
Patient presents to ED via POV due to left knee pain and right shoulder pain. Patient reports at work yesterday he fell off a mower and injured same. Ambulatory to triage. Denies hitting head, denies LOC.

## 2018-12-07 NOTE — ED Provider Notes (Signed)
[facility] Emergency Department Provider Note   ____________________________________________   First MD Initiated Contact with Patient 12/07/18 [phone]     (approximate)  I have reviewed the triage vital signs and the nursing notes.   HISTORY  Chief Complaint Knee Pain    HPI Andrew Baird is a 33 y.o. male patient complain of right shoulder and left knee pain secondary to falling off a lawnmower yesterday.  Patient denies LOC or head injury.  Patient is a pain showed increased for adduction and overhead reaching.  Patient edema to the left knee but is able to bear weight.  Patient rates overall pain is 4/10.  Patient described the pain as "achy".  No palliative measure for complaint.         Past Medical History:  Diagnosis Date  . Anxiety   . GERD (gastroesophageal reflux disease)     There are no active problems to display for this patient.   Past Surgical History:  Procedure Laterality Date  . CATARACT SURGERY    . HERNIA REPAIR    . RIGHT KNEE SURGERY      Prior to Admission medications   Medication Sig Start Date End Date Taking? Authorizing Provider  naproxen (NAPROSYN) 500 MG tablet Take 1 tablet (500 mg total) by mouth 2 (two) times daily with a meal. 12/07/18   Joni Reining, PA-C    Allergies Patient has no known allergies.  No family history on file.  Social History Social History   Tobacco Use  . Smoking status: Never Smoker  . Smokeless tobacco: Never Used  Substance Use Topics  . Alcohol use: Yes    Comment: 1-2 BEERS A WEEK  . Drug use: Not on file    Review of Systems Constitutional: No fever/chills Eyes: No visual changes. ENT: No sore throat. Cardiovascular: Denies chest pain. Respiratory: Denies shortness of breath. Gastrointestinal: No abdominal pain.  No nausea, no vomiting.  No diarrhea.  No constipation. Genitourinary: Negative for dysuria. Musculoskeletal: Right shoulder and left knee pain. Skin:  Negative for rash. Neurological: Negative for headaches, focal weakness or numbness. Psychiatric:  Anxiety.   ____________________________________________   PHYSICAL EXAM:  VITAL SIGNS: ED Triage Vitals [12/07/18 0804]  Enc Vitals Group     BP 135/81     Pulse Rate 73     Resp 16     Temp 98.2 F (36.8 C)     Temp Source Oral     SpO2 99 %     Weight 235 lb (106.6 kg)     Height 6\' 6"  (1.981 m)     Head Circumference      Peak Flow      Pain Score 4     Pain Loc      Pain Edu?      Excl. in GC?    Constitutional: Alert and oriented. Well appearing and in no acute distress. Neck:  No cervical spine tenderness to palpation. Hematological/Lymphatic/Immunilogical: No cervical lymphadenopathy. Cardiovascular: Normal rate, regular rhythm. Grossly normal heart sounds.  Good peripheral circulation. Respiratory: Normal respiratory effort.  No retractions. Lungs CTAB. Gastrointestinal: Soft and nontender. No distention. No abdominal bruits. No CVA tenderness. Musculoskeletal: No obvious deformity of the right shoulder.  Patient decreased range of motion with abduction overhead reaching.  Patient strength against resistance at 2/5 compared with left upper extremity.  Examination of the left knee shows an abrasion and mild edema.  Patient has mild crepitus with palpation.  Mild joint  effusion. Neurologic:  Normal speech and language. No gross focal neurologic deficits are appreciated. No gait instability. Skin:  Skin is warm, dry and intact. No rash noted.  Abrasion left anterior superior knee. Psychiatric: Mood and affect are normal. Speech and behavior are normal.  ____________________________________________   LABS (all labs ordered are listed, but only abnormal results are displayed)  Labs Reviewed - No data to display ____________________________________________  EKG   ____________________________________________  RADIOLOGY  ED MD interpretation:    Official  radiology report(s): Dg Shoulder Right  Result Date: 12/07/2018 CLINICAL DATA:  Right shoulder pain.  Larey Seat off of a mower yesterday. EXAM: RIGHT SHOULDER - 2+ VIEW COMPARISON:  None. FINDINGS: The joint spaces are maintained. No acute bony findings or bone lesion. No abnormal soft tissue calcifications. The visualized lung is clear and the visualized ribs are intact. IMPRESSION: No fracture or dislocation. Electronically Signed   By: Rudie Meyer M.D.   On: 12/07/2018 08:57   Dg Knee 2 Views Left  Result Date: 12/07/2018 CLINICAL DATA:  Left knee pain.  Fell yesterday. EXAM: LEFT KNEE - 1-2 VIEW COMPARISON:  None. FINDINGS: The joint spaces are maintained. No acute fracture is identified. Small joint effusion suspected. IMPRESSION: No acute bony findings, significant degenerative changes or osteochondral abnormality. Small joint effusion. Electronically Signed   By: Rudie Meyer M.D.   On: 12/07/2018 09:06    ____________________________________________   PROCEDURES  Procedure(s) performed (including Critical Care):  Procedures   ____________________________________________   INITIAL IMPRESSION / ASSESSMENT AND PLAN / ED COURSE  As part of my medical decision making, I reviewed the following data within the electronic MEDICAL RECORD NUMBER         Right shoulder pain secondary internal derangement.  Discussed negative x-ray findings with patient.  Patient right knee was unremarkable except for small effusion.  Patient given discharge care instructions and a prescription for naproxen.  Patient advised follow-up with the [facility] orthopedic department for right shoulder complaint.      ____________________________________________   FINAL CLINICAL IMPRESSION(S) / ED DIAGNOSES  Final diagnoses:  Internal derangement of right shoulder  Contusion of left knee, initial encounter     ED Discharge Orders         Ordered    naproxen (NAPROSYN) 500 MG tablet  2 times  daily with meals     12/07/18 0916           Note:  This document was prepared using Dragon voice recognition software and may include unintentional dictation errors.    Joni Reining, PA-C 12/07/18 0920    Sharman Cheek, MD 12/10/18 [phone]

## 2018-12-07 NOTE — Discharge Instructions (Addendum)
Advised to follow-up with [facility] orthopedic department for internal derangement of right shoulder.

## 2019-12-27 ENCOUNTER — Ambulatory Visit
Admission: EM | Admit: 2019-12-27 | Discharge: 2019-12-27 | Disposition: A | Discharge status: Home / Self Care | Attending: Family Medicine | Admitting: Family Medicine

## 2019-12-27 DIAGNOSIS — Z1152 Encounter for screening for COVID-19: Secondary | ICD-10-CM

## 2019-12-27 DIAGNOSIS — Z20822 Contact with and (suspected) exposure to covid-19: Secondary | ICD-10-CM

## 2019-12-27 DIAGNOSIS — Z7189 Other specified counseling: Secondary | ICD-10-CM

## 2019-12-27 LAB — SARS CORONAVIRUS 2 (TAT 6-24 HRS): SARS Coronavirus 2: NEGATIVE

## 2019-12-27 NOTE — ED Provider Notes (Signed)
Andrew Baird, Andrew Baird   Name: Andrew Baird DOB: 11-10-1985 MRN: [id number] CSN: [id number] PCP: System, Pcp Not In  Arrival date and time:  12/27/19 0840  Chief Complaint:  Covid Exposure  NOTE: Prior to seeing the patient today, I have reviewed the triage nursing documentation and vital signs. Clinical staff has updated patient's PMH/PSHx, current medication list, and drug allergies/intolerances to ensure comprehensive history available to assist in medical decision making.   History:   HPI: Andrew Baird is a 34 y.o. male who presents today with complaints of recent direct exposure to SARS-CoV-2 (novel coronavirus). Patient reporting that his ex-wife tested (+) for the virus on Sunday (12/25/2019). Patient presents today with no symptoms; no cough, fevers, or other symptoms commonly associated with SARS-CoV-2. He advises that he feels generally well. Patient presents for testing out of concern for his personal health. He comes in with his children and several co-workers today in order to be tested. His boss is also with him and the business has been closed until everyone has been appropriately tested.   Past Medical History:  Diagnosis Date  . Anxiety   . GERD (gastroesophageal reflux disease)     Past Surgical History:  Procedure Laterality Date  . CATARACT SURGERY    . HERNIA REPAIR    . RIGHT KNEE SURGERY      History reviewed. No pertinent family history.  Social History   Tobacco Use  . Smoking status: Never Smoker  . Smokeless tobacco: Never Used  Substance Use Topics  . Alcohol use: Yes    Comment: 1-2 BEERS A WEEK  . Drug use: Not on file    There are no problems to display for this patient.   Home Medications:    No outpatient medications have been marked as taking for the 12/27/19 encounter [facility] Encounter).    Allergies:   Patient has no known allergies.  Review of Systems (ROS): Review of Systems  Constitutional: Negative for fatigue and fever.  HENT:  Negative for congestion, ear pain, postnasal drip, rhinorrhea, sinus pressure, sinus pain, sneezing and sore throat.   Eyes: Negative for pain, discharge and redness.  Respiratory: Negative for cough, chest tightness and shortness of breath.   Cardiovascular: Negative for chest pain and palpitations.  Gastrointestinal: Negative for abdominal pain, diarrhea, nausea and vomiting.  Musculoskeletal: Negative for arthralgias, back pain, myalgias and neck pain.  Skin: Negative for color change, pallor and rash.  Neurological: Negative for dizziness, syncope, weakness and headaches.  Hematological: Negative for adenopathy.   Vital Signs: Today's Vitals   12/27/19 0843 12/27/19 0844 12/27/19 0856  BP:  119/87   Pulse:  71   Resp:  18   Temp:  97.9 F (36.6 C)   TempSrc:  Oral   SpO2:  98%   Weight: 250 lb (113.4 kg)    Height: 6\' 6"  (1.981 m)    PainSc: 0-No pain  0-No pain    Physical Exam: Physical Exam  Constitutional: Oriented to person, place, and time and well-developed, well-nourished, and in no distress.  HENT:  Head: Normocephalic and atraumatic.  Eyes: Pupils are equal, round, and reactive to light.  Cardiovascular: Normal rate, regular rhythm, normal heart sounds and intact distal pulses.  Pulmonary/Chest: Effort normal and breath sounds normal.  Neurological: She is alert and oriented to person, place, and time. Gait normal.  Skin: Skin is warm and dry. No rash noted. Not diaphoretic.  Psychiatric: Mood, memory, affect and judgment normal.  Nursing note  and vitals reviewed.  Urgent Care Treatments / Results:   Orders Placed This Encounter  Procedures  . SARS CORONAVIRUS 2 (TAT 6-24 HRS) Nasopharyngeal Nasopharyngeal Swab    LABS: PLEASE NOTE: all labs that were ordered this encounter are listed, however only abnormal results are displayed. Labs Reviewed  SARS CORONAVIRUS 2 (TAT 6-24 HRS)    EKG: -None  RADIOLOGY: No results  found.  PROCEDURES: Procedures  MEDICATIONS RECEIVED THIS VISIT: Medications - No data to display  PERTINENT CLINICAL COURSE NOTES/UPDATES:   Initial Impression / Assessment and Plan / Urgent Care Course:  Pertinent labs & imaging results that were available during my care of the patient were personally reviewed by me and considered in my medical decision making (see lab/imaging section of note for values and interpretations).  Andrew Baird is a 34 y.o. male who presents to [facility] today with complaints of Covid Exposure  Patient overall well appearing and in no acute distress today in clinic. Presenting symptoms (see HPI) and exam as documented above. He presents following a direct exposure to SARS-CoV-2 (novel coronavirus). Discussed typical symptom constellation and patient reaffirms that he has none of the commonly associated symptoms seen with SARS-CoV-2 infection. Reviewed potential for infection with recent close contact. Given exposure and potential for infection, testing is reasonable. SARS-CoV-2 swab collected by certified clinical staff. Discussed variable turn around times associated with testing, as swabs are being processed at the main campus of [facility] in [place], and have been taking 12-24 hours to come back. He was advised to self quarantine, per [facility] DHHS guidelines, until negative results received. These measures are being implemented out of an abundance of caution to prevent transmission and spread during the current SARS-CoV-2 pandemic.   Discussed follow up with primary care physician should he develop any concerning symptoms. I have reviewed the follow up and strict return precautions for any new or worsening symptoms. Patient is aware of symptoms that would be deemed urgent/emergent, and would thus require further evaluation either here or in the emergency department. At the time of discharge, he verbalized understanding and consent with the discharge plan as  it was reviewed with him. All questions were fielded by provider and/or clinic staff prior to patient discharge.     Final Clinical Impressions / Urgent Care Diagnoses:   Final diagnoses:  Exposure to COVID-19 virus  Encounter for screening laboratory testing for COVID-19 virus in asymptomatic patient  Advice given about COVID-19 virus infection    New Prescriptions:  [place] Controlled Substance Registry consulted? Not Applicable  No orders of the defined types were placed in this encounter.   Recommended Follow up Care:  Patient encouraged to follow up with the following provider within the specified time frame, or sooner as dictated by the severity of his symptoms. As always, he was instructed that for any urgent/emergent care needs, he should seek care either here or in the emergency department for more immediate evaluation.  Follow-up Information    PCP.   Why: As needed        NOTE: This note was prepared using Scientist, clinical (histocompatibility and immunogenetics) along with smaller Lobbyist. Despite my best ability to proofread, there is the potential that transcriptional errors may still occur from this process, and are completely unintentional.    Verlee Monte, NP 12/27/19 1052

## 2019-12-27 NOTE — Discharge Instructions (Addendum)
It was very nice seeing you today in clinic. Thank you for entrusting me with your care.   You were tested for SARS-CoV-2 (novel coronavirus) today. Testing is being processed at the main campus of [facility] in [place], and have been taking 12-24 hours to come back. Current recommendations from the the CDC and [place] DHHS require that you remain out of work in order to quarantine at home until negative test results are have been received. In the event that your test results are positive, you will be contacted with further directives. These measures are being implemented out of an abundance of caution to prevent transmission and spread during the current SARS-CoV-2 pandemic.  If you develop any worsening symptoms or concerns, make arrangements to follow up with your regular doctor. If your symptoms are severe, please seek follow up care in the ER. Please remember, our [facility] providers are "right here with you" when you need us.   Again, it was my pleasure to take care of you today. Thank you for choosing our clinic. I hope that you start to feel better quickly.   Gilford Lardizabal, MSN, APRN, FNP-C, CEN Advanced Practice Provider [facility] 

## 2019-12-27 NOTE — ED Triage Notes (Signed)
Pt presents with c/o exposure to COVID positive person. Pt denies any current COVID symptoms. He would like to be tested.

## 2020-02-09 ENCOUNTER — Other Ambulatory Visit: Payer: Self-pay | Admitting: Podiatry

## 2020-02-09 ENCOUNTER — Encounter: Payer: Self-pay | Admitting: *Deleted

## 2020-02-09 ENCOUNTER — Other Ambulatory Visit: Payer: Self-pay

## 2020-02-09 ENCOUNTER — Ambulatory Visit (INDEPENDENT_AMBULATORY_CARE_PROVIDER_SITE_OTHER): Payer: Worker's Compensation

## 2020-02-09 ENCOUNTER — Ambulatory Visit (INDEPENDENT_AMBULATORY_CARE_PROVIDER_SITE_OTHER): Payer: Worker's Compensation | Admitting: Podiatry

## 2020-02-09 DIAGNOSIS — M79671 Pain in right foot: Secondary | ICD-10-CM

## 2020-02-09 DIAGNOSIS — T148XXA Other injury of unspecified body region, initial encounter: Secondary | ICD-10-CM

## 2020-02-09 DIAGNOSIS — S93491A Sprain of other ligament of right ankle, initial encounter: Secondary | ICD-10-CM | POA: Diagnosis not present

## 2020-02-09 DIAGNOSIS — R52 Pain, unspecified: Secondary | ICD-10-CM | POA: Diagnosis not present

## 2020-02-10 ENCOUNTER — Encounter: Payer: Self-pay | Admitting: Podiatry

## 2020-02-10 NOTE — Progress Notes (Signed)
  Subjective:  Patient ID: Andrew Baird, male    DOB: January 29, 1986,  MRN: [id number]  Chief Complaint  Patient presents with  . Foot Pain    pt is here for right foot, pain has been going on for 4 plus months, pt states that pain is elevated when he is keeping it elevated    34 y.o. male presents with the above complaint.  Patient presents with right foot pain that is generalized in nature.  Patient states that this has been hurting him for quite some time.  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  He states the right side is the worst one in the left side.  Is been going for 4 to 5 months has progressively gotten worse.  Patient states he started get bed since twisting his foot in the [employer].  His pain scale is 3 out of 10.  He does have a lot of pain to the lateral side of the ankle.  He denies any other acute complaints.  Like to know if there is anything that could be done to help calm the generalized inflammation.  He denies any other acute complaints.   Review of Systems: Negative except as noted in the HPI. Denies N/V/F/Ch.  Past Medical History:  Diagnosis Date  . Anxiety   . GERD (gastroesophageal reflux disease)    No current outpatient medications on file.  Social History   Tobacco Use  Smoking Status Never Smoker  Smokeless Tobacco Never Used    No Known Allergies Objective:  There were no vitals filed for this visit. There is no height or weight on file to calculate BMI. Constitutional Well developed. Well nourished.  Vascular Dorsalis pedis pulses palpable bilaterally. Posterior tibial pulses palpable bilaterally. Capillary refill normal to all digits.  No cyanosis or clubbing noted. Pedal hair growth normal.  Neurologic Normal speech. Oriented to person, place, and time. Epicritic sensation to light touch grossly present bilaterally.  Dermatologic Nails well groomed and normal in appearance. No open wounds. No skin lesions.  Orthopedic:  Pain  on palpation to the right ATFL.  Pain with inversion plantarflexion of the foot with active and passive range of motion.  Pain on dorsal foot.  Generalized pain across the entire forefoot.  No focal site noted.   Radiographs: 3 views of skeletally mature adult right foot: No stress fracture noted.  No fractures noted.  No other bony abnormalities identified. Assessment:   1. Foot pain, right   2. Sprain of anterior talofibular ligament of right ankle, initial encounter   3. Generalized pain    Plan:  Patient was evaluated and treated and all questions answered.  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  Given that patient has a lot of generalized pain without any focal location I believe he will benefit from cam boot immobilization at this time to allow for soft tissue to properly heal.  Once the generalized inflammation has calmed down, I am hopeful that we might be able to locate a focal etiology of the pain. -Cam boot was dispensed  No follow-ups on file.

## 2020-03-08 ENCOUNTER — Telehealth: Payer: Self-pay | Admitting: *Deleted

## 2020-03-08 DIAGNOSIS — S93491A Sprain of other ligament of right ankle, initial encounter: Secondary | ICD-10-CM | POA: Diagnosis not present

## 2020-03-08 NOTE — Telephone Encounter (Signed)
"  I have an employer that was sent there on May 6.  His name is Andrew Baird.  He saw Dr. Allena Katz.  I just want to touch base.  I got the first letter that he gave as far as [name] being out until June 14.  But, [name] mentioned to me that he had talked to him again a couple of days ago and that Dr. Allena Katz said he could work three days a week.  I want to get on the same page and see if I can get email documentation.  I know because of HIPAA, you can't give all of it.  I just need the employer aspect of what he can and cannot work.  My email is [email or web address]."

## 2020-03-15 ENCOUNTER — Encounter: Payer: Self-pay | Admitting: *Deleted

## 2020-03-15 ENCOUNTER — Other Ambulatory Visit: Payer: Self-pay

## 2020-03-15 ENCOUNTER — Ambulatory Visit (INDEPENDENT_AMBULATORY_CARE_PROVIDER_SITE_OTHER): Payer: Worker's Compensation | Admitting: Podiatry

## 2020-03-15 DIAGNOSIS — M79671 Pain in right foot: Secondary | ICD-10-CM | POA: Diagnosis not present

## 2020-03-15 DIAGNOSIS — R52 Pain, unspecified: Secondary | ICD-10-CM

## 2020-03-15 DIAGNOSIS — S93491A Sprain of other ligament of right ankle, initial encounter: Secondary | ICD-10-CM | POA: Diagnosis not present

## 2020-03-16 ENCOUNTER — Encounter: Payer: Self-pay | Admitting: Podiatry

## 2020-03-16 NOTE — Progress Notes (Signed)
  Subjective:  Patient ID: Andrew Baird, male    DOB: 04-28-1986,  MRN: [id number]  Chief Complaint  Patient presents with  . Foot Pain    pt is here for right foot pain f/u, pt states that he is still in pain since the last time he was here, pt states that he has not taken off work. Pt also states that the cam boot has not been helping    34 y.o. male presents with the above complaint.  Patient presents with follow-up of right ankle pain that happened from a job-related injury.  Patient states that he has not taken time off work.  The cam boot is not helping.  Patient states that the pain scale is 4 out of 10 the pain is on and off.  He states pain is elevated when to touch.  However I was able to pinpoint the pain this time to ATFL as well as ankle joint as well.  He denies any other acute complaints.  He would like to discuss further treatment plans.  He also does not want any steroid injections as he is terrified of needles.   Review of Systems: Negative except as noted in the HPI. Denies N/V/F/Ch.  Past Medical History:  Diagnosis Date  . Anxiety   . GERD (gastroesophageal reflux disease)    No current outpatient medications on file.  Social History   Tobacco Use  Smoking Status Never Smoker  Smokeless Tobacco Never Used    No Known Allergies Objective:  There were no vitals filed for this visit. There is no height or weight on file to calculate BMI. Constitutional Well developed. Well nourished.  Vascular Dorsalis pedis pulses palpable bilaterally. Posterior tibial pulses palpable bilaterally. Capillary refill normal to all digits.  No cyanosis or clubbing noted. Pedal hair growth normal.  Neurologic Normal speech. Oriented to person, place, and time. Epicritic sensation to light touch grossly present bilaterally.  Dermatologic Nails well groomed and normal in appearance. No open wounds. No skin lesions.  Orthopedic:  Pain on palpation to the right ATFL.  Pain with  inversion plantarflexion of the foot with active and passive range of motion.  Pain on dorsal foot.  Generalized pain across the entire forefoot.  No focal site noted.   Radiographs: 3 views of skeletally mature adult right foot: No stress fracture noted.  No fractures noted.  No other bony abnormalities identified. Assessment:   No diagnosis found. Plan:  Patient was evaluated and treated and all questions answered.  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  I believe patient will benefit from MRI evaluation of the right ankle given that he has a lot of pain with a possible rupture of the ATFL versus osteochondral lesion. -He will be scheduled to get an MRI of the right ankle. -Continue wearing cam boot for immobilization of the ankle joint.   No follow-ups on file.

## 2020-03-20 NOTE — Telephone Encounter (Signed)
Who is your employer?  "My employer is [employer]."  What is the date of your injury?  "It was in February."  I need an exact date.  "I called and was told it's October 19, 2019."  That's all I need for now.

## 2020-03-21 ENCOUNTER — Telehealth: Payer: Self-pay | Admitting: *Deleted

## 2020-03-21 NOTE — Telephone Encounter (Signed)
Sharion Balloon, Training and development officer, requested clinical notes for Mr. Parodi.  I faxed her the requested information.

## 2020-04-16 ENCOUNTER — Other Ambulatory Visit: Payer: Self-pay

## 2020-04-16 ENCOUNTER — Ambulatory Visit (INDEPENDENT_AMBULATORY_CARE_PROVIDER_SITE_OTHER): Payer: Worker's Compensation | Admitting: Podiatry

## 2020-04-16 ENCOUNTER — Encounter: Payer: Self-pay | Admitting: Podiatry

## 2020-04-16 DIAGNOSIS — M79671 Pain in right foot: Secondary | ICD-10-CM | POA: Diagnosis not present

## 2020-04-16 DIAGNOSIS — S93491A Sprain of other ligament of right ankle, initial encounter: Secondary | ICD-10-CM

## 2020-04-16 DIAGNOSIS — R52 Pain, unspecified: Secondary | ICD-10-CM | POA: Diagnosis not present

## 2020-04-16 NOTE — Progress Notes (Signed)
  Subjective:  Patient ID: Andrew Baird, male    DOB: [birth year],  MRN: [id number]  Chief Complaint  Patient presents with  . Ankle Pain      "its no better"    34 y.o. male presents with the above complaint.  Patient presents in follow-up of right ankle injury that happened from a job-related injury.  Patient states he has not taken any time off of work.  The cam boot is not helping.  His pain scale still 4 out of 10.  He was not able to get the MRI due to mixup in communication.  He would like to continue obtaining the MRI.  He denies any other acute complaints.   Review of Systems: Negative except as noted in the HPI. Denies N/V/F/Ch.  Past Medical History:  Diagnosis Date  . Anxiety   . GERD (gastroesophageal reflux disease)    No current outpatient medications on file.  Social History   Tobacco Use  Smoking Status Never Smoker  Smokeless Tobacco Never Used    No Known Allergies Objective:  There were no vitals filed for this visit. There is no height or weight on file to calculate BMI. Constitutional Well developed. Well nourished.  Vascular Dorsalis pedis pulses palpable bilaterally. Posterior tibial pulses palpable bilaterally. Capillary refill normal to all digits.  No cyanosis or clubbing noted. Pedal hair growth normal.  Neurologic Normal speech. Oriented to person, place, and time. Epicritic sensation to light touch grossly present bilaterally.  Dermatologic Nails well groomed and normal in appearance. No open wounds. No skin lesions.  Orthopedic:  Pain on palpation to the right ATFL.  Pain with inversion plantarflexion of the foot with active and passive range of motion.  Pain on dorsal foot.  Generalized pain across the entire forefoot.  No focal site noted.   Radiographs: 3 views of skeletally mature adult right foot: No stress fracture noted.  No fractures noted.  No other bony abnormalities identified. Assessment:   1. Sprain of anterior talofibular  ligament of right ankle, initial encounter   2. Generalized pain   3. Foot pain, right    Plan:  Patient was evaluated and treated and all questions answered.  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  I believe patient will benefit from MRI evaluation of the right ankle given that he has a lot of pain with a possible rupture of the ATFL versus osteochondral lesion. -He will be scheduled to get an MRI of the right ankle. -Continue wearing cam boot for immobilization of the ankle joint.   No follow-ups on file.

## 2020-04-21 IMAGING — CR DG KNEE 1-2V*L*
1 series · 2 of 2 positions shown · non-contrast
Comparison: None.

CLINICAL DATA: Left knee pain.  Fell yesterday.

EXAM:
LEFT KNEE - 1-2 VIEW

[Series 1: dg knee 1-2 views left · 0.14mm/px · 2 of 2 slices shown]
[im 1/2]
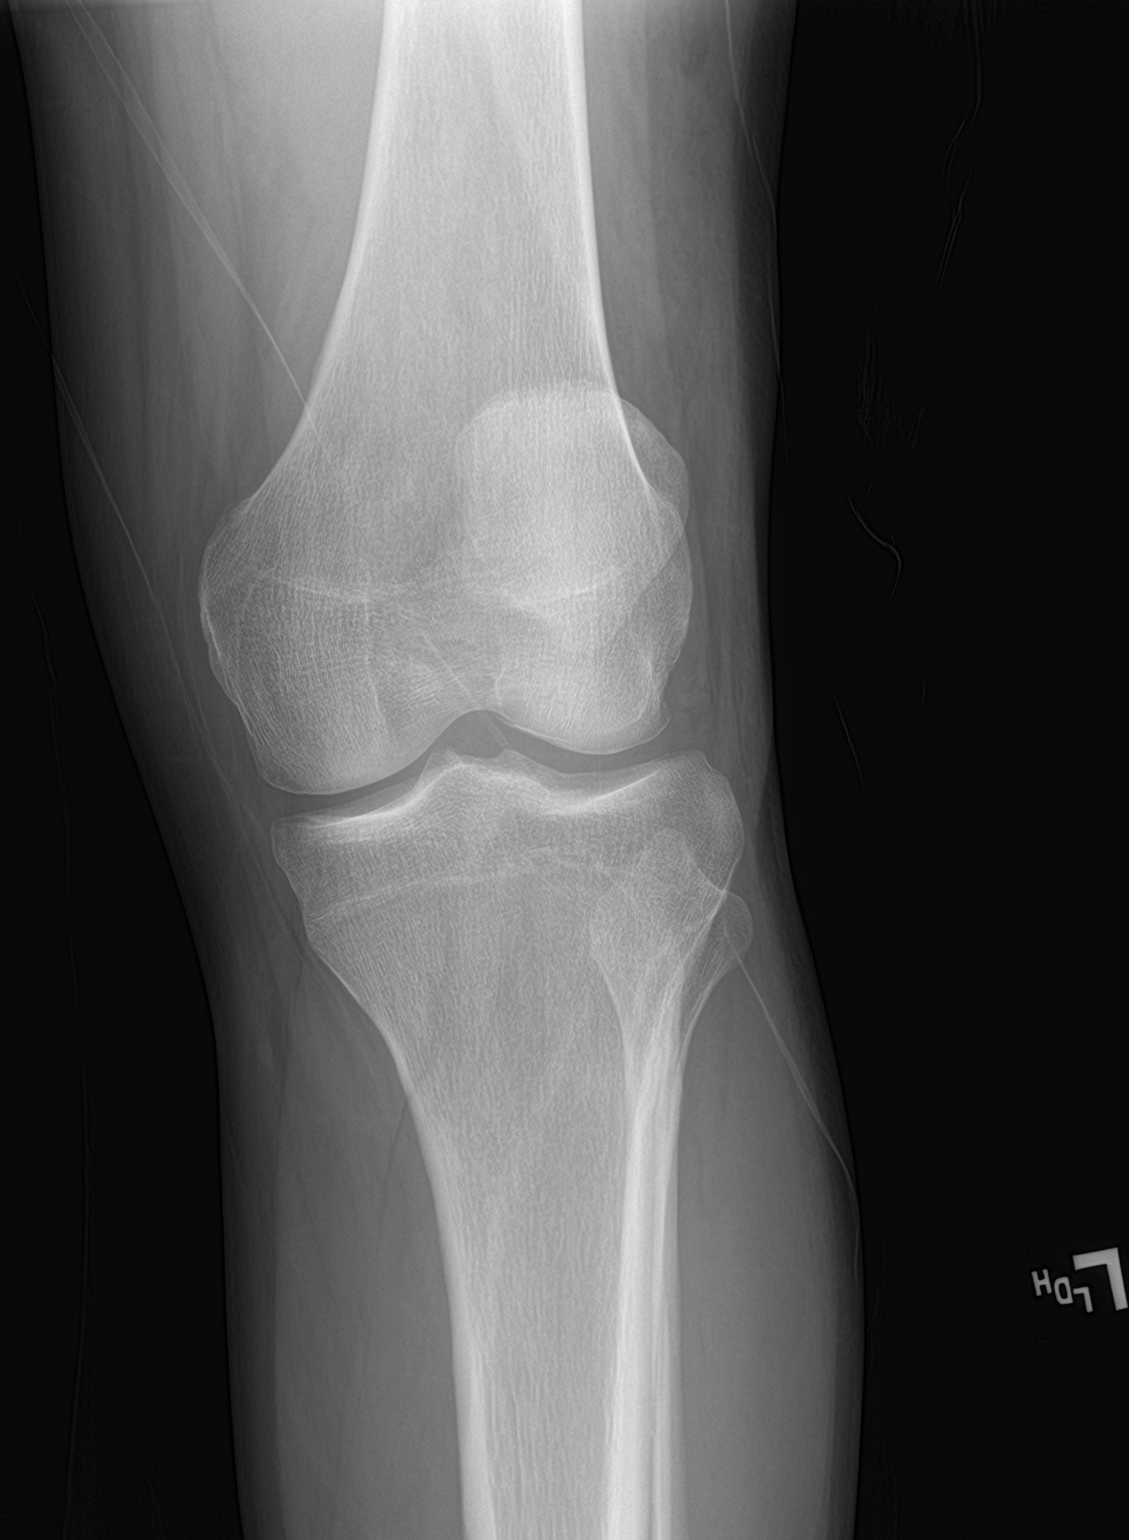
[im 2/2]
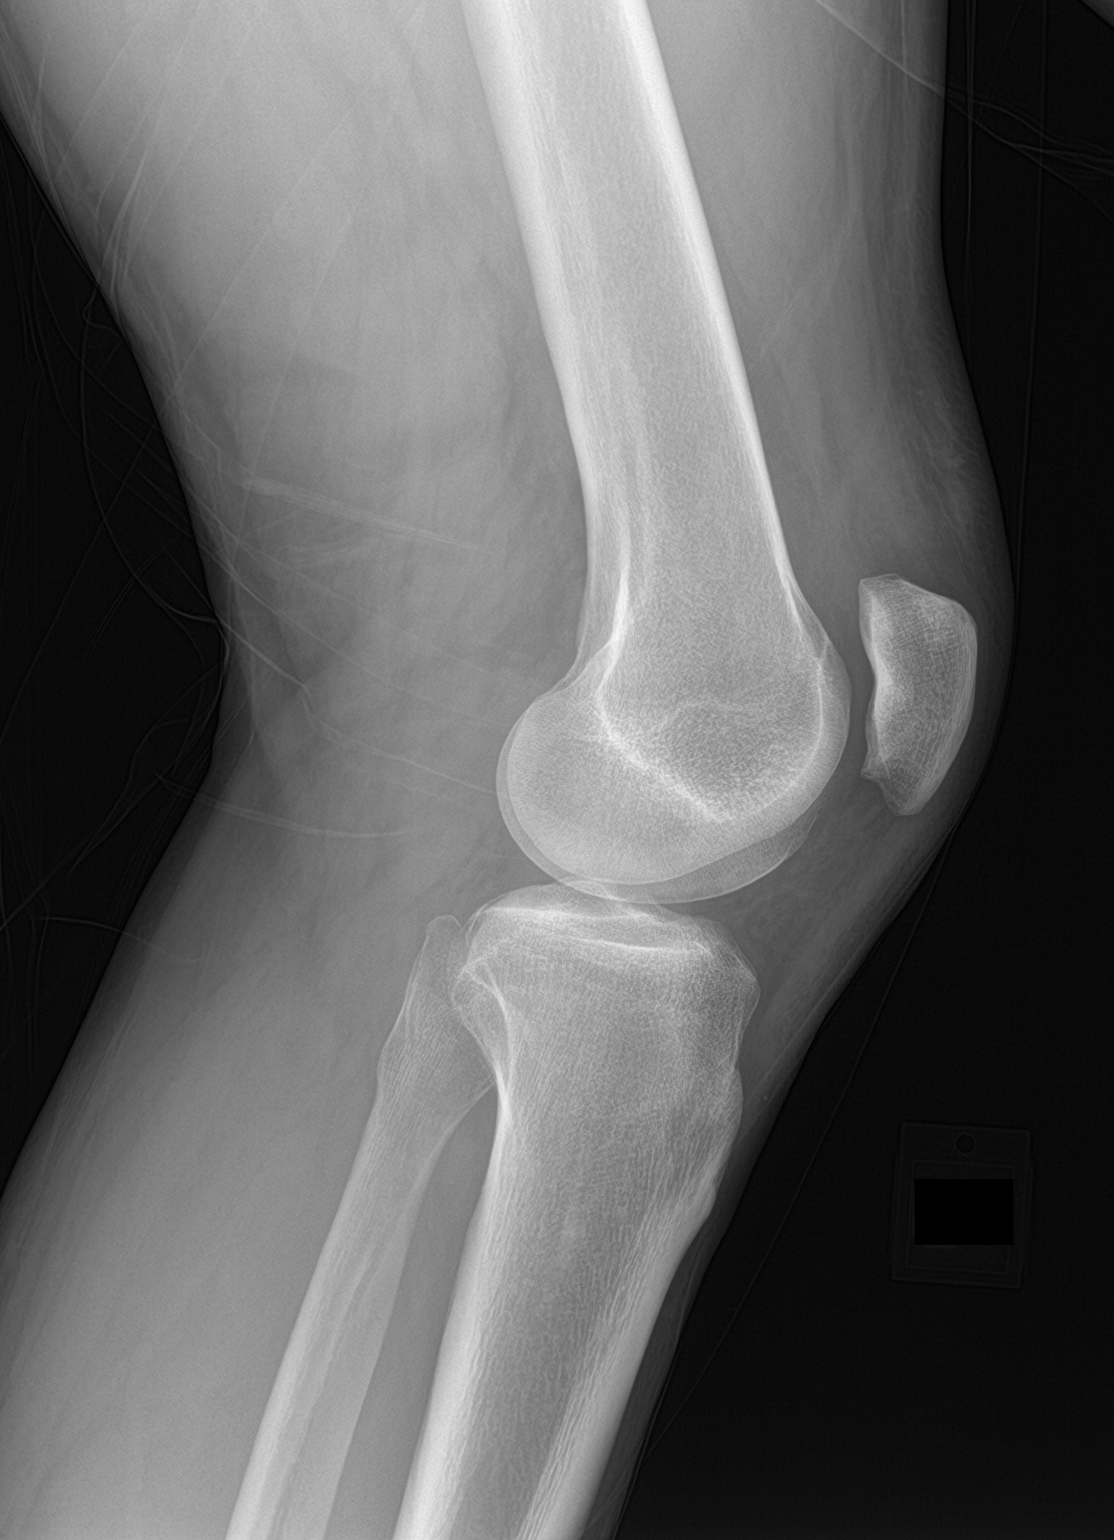

[2 of 2 positions shown; findings below may reference images not displayed]

FINDINGS: The joint spaces are maintained. No acute fracture is identified.
Small joint effusion suspected.
IMPRESSION: No acute bony findings, significant degenerative changes or
osteochondral abnormality.

Small joint effusion.

## 2020-05-06 ENCOUNTER — Ambulatory Visit
Admission: EM | Admit: 2020-05-06 | Discharge: 2020-05-06 | Disposition: A | Discharge status: Home / Self Care | Attending: Family Medicine | Admitting: Family Medicine

## 2020-05-06 ENCOUNTER — Encounter: Payer: Self-pay | Admitting: Emergency Medicine

## 2020-05-06 ENCOUNTER — Other Ambulatory Visit: Payer: Self-pay

## 2020-05-06 DIAGNOSIS — Z20822 Contact with and (suspected) exposure to covid-19: Secondary | ICD-10-CM | POA: Diagnosis not present

## 2020-05-06 DIAGNOSIS — J988 Other specified respiratory disorders: Secondary | ICD-10-CM | POA: Diagnosis not present

## 2020-05-06 MED ORDER — KETOROLAC TROMETHAMINE 10 MG PO TABS: 10.0000 mg | ORAL_TABLET | Freq: Four times a day (QID) | ORAL | 0 refills | Status: DC | PRN

## 2020-05-06 MED ORDER — BENZONATATE 200 MG PO CAPS: 200.0000 mg | ORAL_CAPSULE | Freq: Three times a day (TID) | ORAL | 0 refills | Status: AC | PRN

## 2020-05-06 MED ORDER — BENZONATATE 200 MG PO CAPS: 200.0000 mg | ORAL_CAPSULE | Freq: Three times a day (TID) | ORAL | 0 refills | Status: DC | PRN

## 2020-05-06 MED ORDER — KETOROLAC TROMETHAMINE 10 MG PO TABS: 10.0000 mg | ORAL_TABLET | Freq: Four times a day (QID) | ORAL | 0 refills | Status: AC | PRN

## 2020-05-06 NOTE — Discharge Instructions (Addendum)
COVID test result will be back tomorrow.   Medication as prescribed.  Stay home.  Take care  Dr. Adriana Simas

## 2020-05-06 NOTE — ED Triage Notes (Signed)
Patient c/o cough, bodyaches and headache that started this morning. Patient denies fevers.

## 2020-05-06 NOTE — ED Provider Notes (Signed)
[facility]    CSN: [id number] Arrival date & time: 05/06/20  1240      History   Chief Complaint Chief Complaint  Patient presents with   Cough   HPI  34 year old male presents with cough, body aches, headache.  Symptoms started this morning.  He has been around his stepmother who has tested positive for COVID-19.  He reports cough, body aches, headache.  He is most bothered by the body aches.  Rates his pain as 2/10 in severity.  No documented fever.  His brother is also sick.  No relieving factors.  No other associated symptoms.  No other complaints.  Past Medical History:  Diagnosis Date   Anxiety    GERD (gastroesophageal reflux disease)    Past Surgical History:  Procedure Laterality Date   CATARACT SURGERY     HERNIA REPAIR     RIGHT KNEE SURGERY         Home Medications    Prior to Admission medications   Medication Sig Start Date End Date Taking? Authorizing Provider  benzonatate (TESSALON) 200 MG capsule Take 1 capsule (200 mg total) by mouth 3 (three) times daily as needed for cough. 05/06/20   Tommie Sams, DO  ketorolac (TORADOL) 10 MG tablet Take 1 tablet (10 mg total) by mouth every 6 (six) hours as needed for moderate pain or severe pain. 05/06/20   Tommie Sams, DO   Social History Social History   Tobacco Use   Smoking status: Never Smoker   Smokeless tobacco: Never Used  Vaping Use   Vaping Use: Never used  Substance Use Topics   Alcohol use: Yes    Comment: 1-2 BEERS A WEEK   Drug use: Not on file     Allergies   Patient has no known allergies.   Review of Systems Review of Systems  Respiratory: Positive for cough.   Musculoskeletal:       Body aches.  Neurological: Positive for headaches.   Physical Exam Triage Vital Signs ED Triage Vitals  Enc Vitals Group     BP 05/06/20 1257 (!) 127/90     Pulse Rate 05/06/20 1257 101     Resp 05/06/20 1257 16     Temp 05/06/20 1257 99.2 F (37.3 C)     Temp  Source 05/06/20 1257 Oral     SpO2 05/06/20 1257 98 %     Weight 05/06/20 1255 (!) 252 lb (114.3 kg)     Height 05/06/20 1255 6\' 6"  (1.981 m)     Head Circumference --      Peak Flow --      Pain Score 05/06/20 1255 2     Pain Loc --      Pain Edu? --      Excl. in GC? --    Updated Vital Signs BP (!) 127/90 (BP Location: Left Arm)    Pulse 101    Temp 99.2 F (37.3 C) (Oral)    Resp 16    Ht 6\' 6"  (1.981 m)    Wt (!) 114.3 kg    SpO2 98%    BMI 29.12 kg/m   Visual Acuity Right Eye Distance:   Left Eye Distance:   Bilateral Distance:    Right Eye Near:   Left Eye Near:    Bilateral Near:     Physical Exam Vitals and nursing note reviewed.  Constitutional:      General: He is not in acute distress.  Appearance: Normal appearance. He is not ill-appearing.  HENT:     Head: Normocephalic and atraumatic.  Eyes:     General:        Right eye: No discharge.        Left eye: No discharge.     Conjunctiva/sclera: Conjunctivae normal.  Cardiovascular:     Rate and Rhythm: Normal rate and regular rhythm.     Heart sounds: No murmur heard.   Pulmonary:     Effort: Pulmonary effort is normal.     Breath sounds: Normal breath sounds. No wheezing, rhonchi or rales.  Neurological:     Mental Status: He is alert.  Psychiatric:        Mood and Affect: Mood normal.        Behavior: Behavior normal.    UC Treatments / Results  Labs (all labs ordered are listed, but only abnormal results are displayed) Labs Reviewed  SARS CORONAVIRUS 2 (TAT 6-24 HRS)    EKG   Radiology No results found.  Procedures Procedures (including critical care time)  Medications Ordered in UC Medications - No data to display  Initial Impression / Assessment and Plan / UC Course  I have reviewed the triage vital signs and the nursing notes.  Pertinent labs & imaging results that were available during my care of the patient were reviewed by me and considered in my medical decision making  (see chart for details).    34 year old male presents with respiratory infection.  Suspected COVID-19 given exposure.  Awaiting test results.  Toradol for body aches.  Tessalon Perles for cough.  Work note given.  Final Clinical Impressions(s) / UC Diagnoses   Final diagnoses:  Respiratory infection  Suspected COVID-19 virus infection     Discharge Instructions     COVID test result will be back tomorrow.   Medication as prescribed.  Stay home.  Take care  Dr. Adriana Simas    ED Prescriptions    Medication Sig Dispense Auth. Provider   benzonatate (TESSALON) 200 MG capsule Take 1 capsule (200 mg total) by mouth 3 (three) times daily as needed for cough. 30 capsule Rhegan Trunnell G, DO   ketorolac (TORADOL) 10 MG tablet Take 1 tablet (10 mg total) by mouth every 6 (six) hours as needed for moderate pain or severe pain. 20 tablet Tommie Sams, DO     PDMP not reviewed this encounter.   Tommie Sams, [place] 05/06/20 1339

## 2020-05-07 LAB — SARS CORONAVIRUS 2 (TAT 6-24 HRS): SARS Coronavirus 2: POSITIVE — AB

## 2020-10-02 ENCOUNTER — Other Ambulatory Visit: Payer: Self-pay

## 2020-10-02 ENCOUNTER — Ambulatory Visit
Admission: EM | Admit: 2020-10-02 | Discharge: 2020-10-02 | Disposition: A | Discharge status: Home / Self Care | Attending: Emergency Medicine | Admitting: Emergency Medicine

## 2020-10-02 DIAGNOSIS — Z20822 Contact with and (suspected) exposure to covid-19: Secondary | ICD-10-CM | POA: Diagnosis not present

## 2020-10-02 DIAGNOSIS — Z01812 Encounter for preprocedural laboratory examination: Secondary | ICD-10-CM | POA: Diagnosis present

## 2020-10-02 DIAGNOSIS — Z1152 Encounter for screening for COVID-19: Secondary | ICD-10-CM

## 2020-10-02 NOTE — ED Notes (Signed)
Called pt & pt states "he is on the way" after hanging up.

## 2020-10-02 NOTE — ED Triage Notes (Addendum)
Pt is here wanting a COVID test after a COVID outbreak at his job yesterday, informed pt that he might still test POSITIVE since he did on 98/21/2021. Pt is denying ALL sxs today.

## 2020-10-03 LAB — NOVEL CORONAVIRUS, NAA (HOSP ORDER, SEND-OUT TO REF LAB; TAT 18-24 HRS): SARS-CoV-2, NAA: NOT DETECTED

## 2021-08-30 ENCOUNTER — Emergency Department
Admission: EM | Admit: 2021-08-30 | Discharge: 2021-08-30 | Disposition: A | Discharge status: Home / Self Care | Attending: Emergency Medicine | Admitting: Emergency Medicine

## 2021-08-30 ENCOUNTER — Other Ambulatory Visit: Payer: Self-pay

## 2021-08-30 DIAGNOSIS — Z5321 Procedure and treatment not carried out due to patient leaving prior to being seen by health care provider: Secondary | ICD-10-CM | POA: Insufficient documentation

## 2021-08-30 DIAGNOSIS — R531 Weakness: Secondary | ICD-10-CM | POA: Diagnosis not present

## 2021-08-30 DIAGNOSIS — M79601 Pain in right arm: Secondary | ICD-10-CM | POA: Diagnosis not present

## 2021-08-30 LAB — CBG MONITORING, ED: Glucose-Capillary: 109 mg/dL — ABNORMAL HIGH (ref 70–99)

## 2021-08-30 NOTE — ED Triage Notes (Signed)
Rt arm pain & weakness that started 4-5 hours ago.

## 2021-09-02 ENCOUNTER — Ambulatory Visit
Admission: EM | Admit: 2021-09-02 | Discharge: 2021-09-02 | Disposition: A | Discharge status: Home / Self Care | Payer: Non-veteran care | Attending: Emergency Medicine | Admitting: Emergency Medicine

## 2021-09-02 ENCOUNTER — Other Ambulatory Visit: Payer: Self-pay

## 2021-09-02 DIAGNOSIS — J111 Influenza due to unidentified influenza virus with other respiratory manifestations: Secondary | ICD-10-CM | POA: Diagnosis not present

## 2021-09-02 MED ORDER — OSELTAMIVIR PHOSPHATE 75 MG PO CAPS: 75.0000 mg | ORAL_CAPSULE | Freq: Two times a day (BID) | ORAL | 0 refills | Status: AC

## 2021-09-02 NOTE — ED Provider Notes (Signed)
[facility]    CSN: [id number] Arrival date & time: 09/02/21  0831      History   Chief Complaint Chief Complaint  Patient presents with   Generalized Body Aches   Chills   Facial Pain    HPI CLAUDIS GIOVANELLI is a 35 y.o. male.   Patient presents with chills, nasal congestion, rhinorrhea, sinus pain and pressure, intermittent nonproductive cough and intermittent generalized headaches for 1 days. Known sick contacts for COVID and flu.  Tolerating food and liquids. attempted use of nyquil, helpful for sleeping.  COVID test negative.  Denies shortness of breath, wheezing, ear pain, sore throat, abdominal pain, nausea, vomiting, diarrhea.  Past Medical History:  Diagnosis Date   Anxiety    GERD (gastroesophageal reflux disease)     There are no problems to display for this patient.   Past Surgical History:  Procedure Laterality Date   CATARACT SURGERY     HERNIA REPAIR     RIGHT KNEE SURGERY         Home Medications    Prior to Admission medications   Medication Sig Start Date End Date Taking? Authorizing Provider  benzonatate (TESSALON) 200 MG capsule Take 1 capsule (200 mg total) by mouth 3 (three) times daily as needed for cough. 05/06/20   Tommie Sams, DO  ketorolac (TORADOL) 10 MG tablet Take 1 tablet (10 mg total) by mouth every 6 (six) hours as needed for moderate pain or severe pain. 05/06/20   Tommie Sams, DO    Family History History reviewed. No pertinent family history.  Social History Social History   Tobacco Use   Smoking status: Never   Smokeless tobacco: Never  Vaping Use   Vaping Use: Never used  Substance Use Topics   Alcohol use: Yes     Allergies   Patient has no known allergies.   Review of Systems Review of Systems  Constitutional:  Positive for chills. Negative for activity change, appetite change, diaphoresis, fatigue, fever and unexpected weight change.  HENT:  Positive for congestion, rhinorrhea, sinus pressure  and sinus pain. Negative for dental problem, drooling, ear discharge, ear pain, facial swelling, hearing loss, mouth sores, nosebleeds, postnasal drip, sneezing, sore throat, tinnitus, trouble swallowing and voice change.   Respiratory:  Positive for cough. Negative for apnea, choking, chest tightness, shortness of breath, wheezing and stridor.   Cardiovascular: Negative.   Gastrointestinal: Negative.   Neurological:  Positive for headaches. Negative for dizziness, tremors, seizures, syncope, facial asymmetry, speech difficulty, weakness, light-headedness and numbness.    Physical Exam Triage Vital Signs ED Triage Vitals  Enc Vitals Group     BP 09/02/21 0920 (!) 128/102     Pulse Rate 09/02/21 0920 84     Resp 09/02/21 0920 16     Temp 09/02/21 0920 98.4 F (36.9 C)     Temp Source 09/02/21 0920 Oral     SpO2 09/02/21 0920 99 %     Weight --      Height --      Head Circumference --      Peak Flow --      Pain Score 09/02/21 0918 0     Pain Loc --      Pain Edu? --      Excl. in GC? --    No data found.  Updated Vital Signs BP (!) 128/102 (BP Location: Left Arm)   Pulse 84   Temp 98.4 F (36.9 C) (Oral)  Resp 16   SpO2 99%   Visual Acuity Right Eye Distance:   Left Eye Distance:   Bilateral Distance:    Right Eye Near:   Left Eye Near:    Bilateral Near:     Physical Exam Constitutional:      Appearance: Normal appearance. He is normal weight.  HENT:     Head: Normocephalic.     Right Ear: Tympanic membrane, ear canal and external ear normal.     Left Ear: Tympanic membrane, ear canal and external ear normal.     Nose: Congestion and rhinorrhea present.     Mouth/Throat:     Mouth: Mucous membranes are moist.     Pharynx: Posterior oropharyngeal erythema present.  Eyes:     Extraocular Movements: Extraocular movements intact.  Cardiovascular:     Rate and Rhythm: Normal rate and regular rhythm.     Pulses: Normal pulses.     Heart sounds: Normal heart  sounds.  Pulmonary:     Effort: Pulmonary effort is normal.     Breath sounds: Normal breath sounds.  Musculoskeletal:     Cervical back: Normal range of motion and neck supple.  Skin:    General: Skin is warm and dry.  Neurological:     Mental Status: He is alert and oriented to person, place, and time. Mental status is at baseline.  Psychiatric:        Mood and Affect: Mood normal.        Behavior: Behavior normal.     UC Treatments / Results  Labs (all labs ordered are listed, but only abnormal results are displayed) Labs Reviewed - No data to display  EKG   Radiology No results found.  Procedures Procedures (including critical care time)  Medications Ordered in UC Medications - No data to display  Initial Impression / Assessment and Plan / UC Course  I have reviewed the triage vital signs and the nursing notes.  Pertinent labs & imaging results that were available during my care of the patient were reviewed by me and considered in my medical decision making (see chart for details).  Influenza-like illness  1.  Tamiflu 75 mg twice daily for 5 days 2.  Over-the-counter medications for remaining symptom management 3.  Urgent care follow-up as needed 4.  Work note given. Final Clinical Impressions(s) / UC Diagnoses   Final diagnoses:  None   Discharge Instructions   None    ED Prescriptions   None    PDMP not reviewed this encounter.   Valinda Hoar, [place] 09/02/21 [phone]

## 2021-09-02 NOTE — ED Triage Notes (Signed)
Patient presents to Urgent Care with complaints of facial pressure, cough, nasal congestion , body aches, and chills since Thursday. Treating symptoms with nyquil.

## 2021-09-02 NOTE — Discharge Instructions (Signed)
Take Tamiflu twice a day for the next 5 days    You can take Tylenol and/or Ibuprofen as needed for fever reduction and pain relief.   For cough: honey 1/2 to 1 teaspoon (you can dilute the honey in water or another fluid).  You can also use guaifenesin and dextromethorphan for cough. You can use a humidifier for chest congestion and cough.  If you don't have a humidifier, you can sit in the bathroom with the hot shower running.      For sore throat: try warm salt water gargles, cepacol lozenges, throat spray, warm tea or water with lemon/honey, popsicles or ice, or OTC cold relief medicine for throat discomfort.   For congestion: take a daily anti-histamine like Zyrtec, Claritin, and a oral decongestant, such as pseudoephedrine.  You can also use Flonase 1-2 sprays in each nostril daily.   It is important to stay hydrated: drink plenty of fluids (water, gatorade/powerade/pedialyte, juices, or teas) to keep your throat moisturized and help further relieve irritation/discomfort.

## 2022-12-19 ENCOUNTER — Ambulatory Visit: Payer: Self-pay | Admitting: Family

## 2022-12-19 ENCOUNTER — Encounter: Payer: Self-pay | Admitting: Family

## 2022-12-19 DIAGNOSIS — Z113 Encounter for screening for infections with a predominantly sexual mode of transmission: Secondary | ICD-10-CM

## 2022-12-19 LAB — HM HIV SCREENING LAB: HM HIV Screening: NEGATIVE

## 2022-12-19 NOTE — Progress Notes (Signed)
[facility] Department STI clinic/screening visit  Subjective:  Andrew Baird is a 37 y.o. male being seen today for an STI screening visit. The patient reports they {Actions; do/do not:19616} have symptoms.    Patient has the following medical conditions:  There are no problems to display for this patient.    Chief Complaint  Patient presents with  . SEXUALLY TRANSMITTED DISEASE    Screening    HPI  Patient reports ***  Last HIV test per patient/review of record was No results found for: "HMHIVSCREEN" No results found for: "HIV"  Does the patient or their partner desires a pregnancy in the next year? {yes/no:20286}  Screening for MPX risk: Does the patient have an unexplained rash? {yes/no:20286} Is the patient MSM? {yes/no:20286} Does the patient endorse multiple sex partners or anonymous sex partners? {yes/no:20286} Did the patient have close or sexual contact with a person diagnosed with MPX? {yes/no:20286} Has the patient traveled outside the [place] where MPX is endemic? {yes/no:20286} Is there a high clinical suspicion for MPX-- evidenced by one of the following {yes/no:20286}  -Unlikely to be chickenpox  -Lymphadenopathy  -Rash that present in same phase of evolution on any given body part   See flowsheet for further details and programmatic requirements.   Immunization History  Administered Date(s) Administered  . Tdap 03/25/2016     The following portions of the patient's history were reviewed and updated as appropriate: allergies, current medications, past medical history, past social history, past surgical history and problem list.  Objective:  There were no vitals filed for this visit.  Physical Exam    Assessment and Plan:  Andrew Baird is a 37 y.o. male presenting to the [facility] Department for STI screening  1. Screening for venereal disease ***   Patient {Action; does/does not:19097} have STI symptoms Patient accepted  all screenings including  urine GC/Chlamydia, and blood work for HIV/Syphilis. Patient meets criteria for HepB screening? {yes/no:20286}. Ordered? {Response; yes/no/na:63} Patient meets criteria for HepC screening? {yes/no:20286}. Ordered? {Response; yes/no/na:63} Recommended condom use with all sex Discussed importance of condom use for STI prevent  Treat positive test results per standing order. Discussed time line for [facility] results and that patient will be called with positive results and encouraged patient to call if he had not heard in 2 weeks Recommended repeat testing in 3 months with positive results. Recommended returning for continued or worsening symptoms.   Return if symptoms worsen or fail to improve.  No future appointments.  Marline Backbone, FNP

## 2022-12-23 LAB — CHLAMYDIA/GC NAA, CONFIRMATION
Chlamydia trachomatis, NAA: NEGATIVE
Neisseria gonorrhoeae, NAA: NEGATIVE

## 2022-12-26 ENCOUNTER — Ambulatory Visit: Payer: Self-pay

## 2023-08-31 ENCOUNTER — Ambulatory Visit: Payer: Self-pay

## 2024-05-12 ENCOUNTER — Encounter
